# Patient Record
Sex: Female | Born: 1990 | Race: White | Hispanic: No | Marital: Married | Smoking: Never smoker
Health system: Southern US, Community
[De-identification: ages and names within clinical notes are randomized; demographics above are authoritative.]

## PROBLEM LIST (undated history)

## (undated) DIAGNOSIS — E611 Iron deficiency: Secondary | ICD-10-CM

## (undated) DIAGNOSIS — T783XXA Angioneurotic edema, initial encounter: Secondary | ICD-10-CM

## (undated) DIAGNOSIS — E162 Hypoglycemia, unspecified: Secondary | ICD-10-CM

## (undated) DIAGNOSIS — R51 Headache: Secondary | ICD-10-CM

## (undated) DIAGNOSIS — I10 Essential (primary) hypertension: Secondary | ICD-10-CM

## (undated) DIAGNOSIS — L509 Urticaria, unspecified: Secondary | ICD-10-CM

## (undated) DIAGNOSIS — F419 Anxiety disorder, unspecified: Secondary | ICD-10-CM

## (undated) DIAGNOSIS — D649 Anemia, unspecified: Secondary | ICD-10-CM

## (undated) DIAGNOSIS — M1990 Unspecified osteoarthritis, unspecified site: Secondary | ICD-10-CM

## (undated) DIAGNOSIS — R519 Headache, unspecified: Secondary | ICD-10-CM

## (undated) DIAGNOSIS — N809 Endometriosis, unspecified: Secondary | ICD-10-CM

## (undated) HISTORY — DX: Angioneurotic edema, initial encounter: T78.3XXA

## (undated) HISTORY — DX: Anxiety disorder, unspecified: F41.9

## (undated) HISTORY — DX: Urticaria, unspecified: L50.9

## (undated) HISTORY — PX: OTHER SURGICAL HISTORY: SHX169

## (undated) HISTORY — DX: Essential (primary) hypertension: I10

## (undated) HISTORY — DX: Hypoglycemia, unspecified: E16.2

---
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
## 2012-11-19 DIAGNOSIS — R079 Chest pain, unspecified: Secondary | ICD-10-CM

## 2014-07-02 ENCOUNTER — Telehealth: Payer: Self-pay | Admitting: Family Medicine

## 2014-07-02 NOTE — Telephone Encounter (Signed)
Pt given new pt appt with Dr.Stacks 3/21 at 9:55, pt aware to arrive 15 minutes prior with a copy of insurance card and a list of all current medications. Pt states she isn't on any current medications.

## 2014-08-10 ENCOUNTER — Encounter: Payer: Self-pay | Admitting: Family Medicine

## 2014-08-10 ENCOUNTER — Ambulatory Visit (INDEPENDENT_AMBULATORY_CARE_PROVIDER_SITE_OTHER): Payer: BLUE CROSS/BLUE SHIELD | Admitting: Family Medicine

## 2014-08-10 VITALS — BP 118/81 | HR 86 | Temp 97.6°F | Ht 62.5 in | Wt 186.0 lb

## 2014-08-10 DIAGNOSIS — F411 Generalized anxiety disorder: Secondary | ICD-10-CM

## 2014-08-10 DIAGNOSIS — R072 Precordial pain: Secondary | ICD-10-CM | POA: Diagnosis not present

## 2014-08-10 LAB — POCT CBC
Granulocyte percent: 76 % (ref 37–80)
HCT, POC: 38.5 % (ref 37.7–47.9)
Hemoglobin: 11.8 g/dL — AB (ref 12.2–16.2)
Lymph, poc: 1.3 (ref 0.6–3.4)
MCH, POC: 22.7 pg — AB (ref 27–31.2)
MCHC: 29.8 g/dL — AB (ref 31.8–35.4)
MCV: 76.2 fL — AB (ref 80–97)
MPV: 7.7 fL (ref 0–99.8)
POC Granulocyte: 4.7 (ref 2–6.9)
POC LYMPH PERCENT: 20.7 % (ref 10–50)
Platelet Count, POC: 352 10*3/uL (ref 142–424)
RBC: 5.18 M/uL (ref 4.04–5.48)
RDW, POC: 16 %
WBC: 6.2 10*3/uL (ref 4.6–10.2)

## 2014-08-10 MED ORDER — DULOXETINE HCL 30 MG PO CPEP: 30.0000 mg | ORAL_CAPSULE | Freq: Every day | ORAL | Status: DC

## 2014-08-10 NOTE — Progress Notes (Signed)
Subjective:  Patient ID: Julie Kim, female    DOB: 1991-03-30  Age: 24 y.o. MRN: [id number]  CC: Establish Care and Depression   HPI Julie Kim presents for BPR 160s/100-110. First noted 2 months ago. [facility] for anxiety.Sudden onset spells since childhood. 30 min. Several X a week. Random. Patient notices significant chest pain and will break down crying. She doesn't know why. She has no thoughts of anything abnormal happening in her life at the time. There may be a sense of dysphoria but not of the ominous occurrence. She went to urgent care for this about 6 months ago and had a EKG performed. She was started on 2 different medicines. She does not remember the names of those. But she could not tolerate them so she discontinued them. Unfortunately the anxiety attacks continued.  History Julie Kim has a past medical history of Anxiety; Hypertension; and Hypoglycemia.   She has past surgical history that includes none.   Her family history includes Heart disease in her paternal uncle; Heart disease (age of onset: 0) in her brother.She reports that she has never smoked. She does not have any smokeless tobacco history on file. She reports that she does not drink alcohol or use illicit drugs.  No current outpatient prescriptions on file prior to visit.   No current facility-administered medications on file prior to visit.    ROS Review of Systems  Constitutional: Negative for fever, chills, diaphoresis, appetite change, fatigue and unexpected weight change.  HENT: Negative for congestion, ear pain, hearing loss, postnasal drip, rhinorrhea, sneezing, sore throat and trouble swallowing.   Eyes: Negative for pain.  Respiratory: Negative for cough, chest tightness and shortness of breath.   Cardiovascular: Negative for chest pain and palpitations.  Gastrointestinal: Negative for nausea, vomiting, abdominal pain, diarrhea and constipation.  Genitourinary: Negative  for dysuria, frequency and menstrual problem.  Musculoskeletal: Negative for joint swelling and arthralgias.  Skin: Negative for rash.  Neurological: Negative for dizziness, weakness, numbness and headaches.  Psychiatric/Behavioral: Positive for dysphoric mood. Negative for suicidal ideas, behavioral problems, confusion, sleep disturbance and agitation. The patient is nervous/anxious.     Objective:  BP 118/81 mmHg  Pulse 86  Temp(Src) 97.6 F (36.4 C) (Oral)  Ht 5' 2.5" (1.588 m)  Wt 186 lb (84.369 kg)  BMI 33.46 kg/m2  LMP 07/12/2014  Physical Exam  Constitutional: She is oriented to person, place, and time. She appears well-developed and well-nourished. No distress.  HENT:  Head: Normocephalic and atraumatic.  Right Ear: External ear normal.  Left Ear: External ear normal.  Nose: Nose normal.  Mouth/Throat: Oropharynx is clear and moist.  Eyes: Conjunctivae and EOM are normal. Pupils are equal, round, and reactive to light.  Neck: Normal range of motion. Neck supple. No thyromegaly present.  Cardiovascular: Normal rate, regular rhythm and normal heart sounds.   No murmur heard. Pulmonary/Chest: Effort normal and breath sounds normal. No respiratory distress. She has no wheezes. She has no rales.  Abdominal: Soft. Bowel sounds are normal. She exhibits no distension. There is no tenderness.  Lymphadenopathy:    She has no cervical adenopathy.  Neurological: She is alert and oriented to person, place, and time. She has normal reflexes.  Skin: Skin is warm and dry.  Psychiatric: She has a normal mood and affect. Her behavior is normal. Judgment and thought content normal.    Assessment & Plan:   1. Generalized anxiety disorder   2. Precordial pain  Meds ordered this encounter  Medications  . DULoxetine (CYMBALTA) 30 MG capsule    Sig: Take 1 capsule (30 mg total) by mouth daily. For one week then two daily. Take with a full stomach at suppertime    Dispense:  60  capsule    Refill:  0    Orders Placed This Encounter  Procedures  . NMR, lipoprofile  . Thyroid Panel With TSH  . CMP14+EGFR  . POCT CBC    Labs pending Health Maintenance reviewed Diet and exercise encouraged Continue all meds as discussed Follow up in 3-4 weeks  Claretta Fraise, MD    I am having Julie Kim start on DULoxetine.  Meds ordered this encounter  Medications  . DULoxetine (CYMBALTA) 30 MG capsule    Sig: Take 1 capsule (30 mg total) by mouth daily. For one week then two daily. Take with a full stomach at suppertime    Dispense:  60 capsule    Refill:  0     Follow-up: Return in about 3 weeks (around 08/31/2014).  Claretta Fraise, M.D.

## 2014-08-10 NOTE — Patient Instructions (Signed)
DASH Eating Plan °DASH stands for "Dietary Approaches to Stop Hypertension." The DASH eating plan is a healthy eating plan that has been shown to reduce high blood pressure (hypertension). Additional health benefits may include reducing the risk of type 2 diabetes mellitus, heart disease, and stroke. The DASH eating plan may also help with weight loss. °WHAT DO I NEED TO KNOW ABOUT THE DASH EATING PLAN? °For the DASH eating plan, you will follow these general guidelines: °· Choose foods with a percent daily value for sodium of less than 5% (as listed on the food label). °· Use salt-free seasonings or herbs instead of table salt or sea salt. °· Check with your health care provider or pharmacist before using salt substitutes. °· Eat lower-sodium products, often labeled as "lower sodium" or "no salt added." °· Eat fresh foods. °· Eat more vegetables, fruits, and low-fat dairy products. °· Choose whole grains. Look for the word "whole" as the first word in the ingredient list. °· Choose fish and skinless chicken or turkey more often than red meat. Limit fish, poultry, and meat to 6 oz (170 g) each day. °· Limit sweets, desserts, sugars, and sugary drinks. °· Choose heart-healthy fats. °· Limit cheese to 1 oz (28 g) per day. °· Eat more home-cooked food and less restaurant, buffet, and fast food. °· Limit fried foods. °· Cook foods using methods other than frying. °· Limit canned vegetables. If you do use them, rinse them well to decrease the sodium. °· When eating at a restaurant, ask that your food be prepared with less salt, or no salt if possible. °WHAT FOODS CAN I EAT? °Seek help from a dietitian for individual calorie needs. °Grains °Whole grain or whole wheat bread. Brown rice. Whole grain or whole wheat pasta. Quinoa, bulgur, and whole grain cereals. Low-sodium cereals. Corn or whole wheat flour tortillas. Whole grain cornbread. Whole grain crackers. Low-sodium crackers. °Vegetables °Fresh or frozen vegetables  (raw, steamed, roasted, or grilled). Low-sodium or reduced-sodium tomato and vegetable juices. Low-sodium or reduced-sodium tomato sauce and paste. Low-sodium or reduced-sodium canned vegetables.  °Fruits °All fresh, canned (in natural juice), or frozen fruits. °Meat and Other Protein Products °Ground beef (85% or leaner), grass-fed beef, or beef trimmed of fat. Skinless chicken or turkey. Ground chicken or turkey. Pork trimmed of fat. All fish and seafood. Eggs. Dried beans, peas, or lentils. Unsalted nuts and seeds. Unsalted canned beans. °Dairy °Low-fat dairy products, such as skim or 1% milk, 2% or reduced-fat cheeses, low-fat ricotta or cottage cheese, or plain low-fat yogurt. Low-sodium or reduced-sodium cheeses. °Fats and Oils °Tub margarines without trans fats. Light or reduced-fat mayonnaise and salad dressings (reduced sodium). Avocado. Safflower, olive, or canola oils. Natural peanut or almond butter. °Other °Unsalted popcorn and pretzels. °The items listed above may not be a complete list of recommended foods or beverages. Contact your dietitian for more options. °WHAT FOODS ARE NOT RECOMMENDED? °Grains °White bread. White pasta. White rice. Refined cornbread. Bagels and croissants. Crackers that contain trans fat. °Vegetables °Creamed or fried vegetables. Vegetables in a cheese sauce. Regular canned vegetables. Regular canned tomato sauce and paste. Regular tomato and vegetable juices. °Fruits °Dried fruits. Canned fruit in light or heavy syrup. Fruit juice. °Meat and Other Protein Products °Fatty cuts of meat. Ribs, chicken wings, bacon, sausage, bologna, salami, chitterlings, fatback, hot dogs, bratwurst, and packaged luncheon meats. Salted nuts and seeds. Canned beans with salt. °Dairy °Whole or 2% milk, cream, half-and-half, and cream cheese. Whole-fat or sweetened yogurt. Full-fat   cheeses or blue cheese. Nondairy creamers and whipped toppings. Processed cheese, cheese spreads, or cheese  curds. °Condiments °Onion and garlic salt, seasoned salt, table salt, and sea salt. Canned and packaged gravies. Worcestershire sauce. Tartar sauce. Barbecue sauce. Teriyaki sauce. Soy sauce, including reduced sodium. Steak sauce. Fish sauce. Oyster sauce. Cocktail sauce. Horseradish. Ketchup and mustard. Meat flavorings and tenderizers. Bouillon cubes. Hot sauce. Tabasco sauce. Marinades. Taco seasonings. Relishes. °Fats and Oils °Butter, stick margarine, lard, shortening, ghee, and bacon fat. Coconut, palm kernel, or palm oils. Regular salad dressings. °Other °Pickles and olives. Salted popcorn and pretzels. °The items listed above may not be a complete list of foods and beverages to avoid. Contact your dietitian for more information. °WHERE CAN I FIND MORE INFORMATION? °National Heart, [employer], and [employer] Institute: www.nhlbi.nih.gov/health/health-topics/topics/dash/ °Document Released: 04/27/2011 Document Revised: 09/22/2013 Document Reviewed: 03/12/2013 °ExitCare® Patient Information ©2015 [employer]. This information is not intended to replace advice given to you by your health care provider. Make sure you discuss any questions you have with your health care provider. ° °

## 2014-08-11 LAB — CMP14+EGFR
ALT: 18 [IU]/L (ref 0–32)
AST: 16 [IU]/L (ref 0–40)
Albumin/Globulin Ratio: 1.7 (ref 1.1–2.5)
Albumin: 4.7 g/dL (ref 3.5–5.5)
Alkaline Phosphatase: 101 [IU]/L (ref 39–117)
BUN/Creatinine Ratio: 16 (ref 8–20)
BUN: 10 mg/dL (ref 6–20)
Bilirubin Total: 0.3 mg/dL (ref 0.0–1.2)
CO2: 23 mmol/L (ref 18–29)
Calcium: 9.6 mg/dL (ref 8.7–10.2)
Chloride: 101 mmol/L (ref 97–108)
Creatinine, Ser: 0.62 mg/dL (ref 0.57–1.00)
GFR calc Af Amer: 147 mL/min/{1.73_m2} (ref >59)
GFR calc non Af Amer: 128 mL/min/{1.73_m2} (ref >59)
Globulin, Total: 2.8 g/dL (ref 1.5–4.5)
Glucose: 81 mg/dL (ref 65–99)
Potassium: 4.4 mmol/L (ref 3.5–5.2)
Sodium: 138 mmol/L (ref 134–144)
Total Protein: 7.5 g/dL (ref 6.0–8.5)

## 2014-08-11 LAB — THYROID PANEL WITH TSH
Free Thyroxine Index: 2.3 (ref 1.2–4.9)
T3 Uptake Ratio: 27 % (ref 24–39)
T4, Total: 8.7 ug/dL (ref 4.5–12.0)
TSH: 2.45 u[IU]/mL (ref 0.450–4.500)

## 2014-08-11 LAB — NMR, LIPOPROFILE
Cholesterol: 173 mg/dL (ref 100–199)
HDL Cholesterol by NMR: 41 mg/dL (ref >39)
HDL Particle Number: 30 umol/L — ABNORMAL LOW (ref >=30.5)
LDL Particle Number: 1099 nmol/L — ABNORMAL HIGH (ref <1000)
LDL Size: 20.7 nmol (ref >20.5)
LDL-C: 93 mg/dL (ref 0–99)
LP-IR Score: 75 — ABNORMAL HIGH (ref <=45)
Small LDL Particle Number: 522 nmol/L (ref <=527)
Triglycerides by NMR: 193 mg/dL — ABNORMAL HIGH (ref 0–149)

## 2014-08-27 ENCOUNTER — Telehealth: Payer: Self-pay | Admitting: Family Medicine

## 2014-08-27 MED ORDER — ESCITALOPRAM OXALATE 10 MG PO TABS: 10.0000 mg | ORAL_TABLET | Freq: Every day | ORAL | Status: DC

## 2014-08-27 NOTE — Telephone Encounter (Signed)
Pt aware rx sent over to the pharmacy and advised to continue to monitor BP and report back if the readings are still elevated. Pt voiced understanding.

## 2014-08-27 NOTE — Telephone Encounter (Signed)
Stp and she states her BP was running 180's/100 last night. Pt was wondering if it is from the Cymbalta but noticed in your notes she had told you her BP readings prior to her appt were 160's/100-110. Do you want her to keep a log and come in to see you or do you want her to be seen? Please advise.

## 2014-08-27 NOTE — Telephone Encounter (Signed)
It is possible for Cymbalta to elevate blood pressure. Although it might be that it is just causing the pre-existing high blood pressure to go even higher.   Because of that she should discontinue the medicine. Please substitute escitalopram 10 mg 1 daily #30 no refills and call that in. Thanks, WS.

## 2015-03-11 ENCOUNTER — Ambulatory Visit: Payer: BLUE CROSS/BLUE SHIELD | Admitting: Family Medicine

## 2015-03-12 ENCOUNTER — Ambulatory Visit (INDEPENDENT_AMBULATORY_CARE_PROVIDER_SITE_OTHER): Payer: BLUE CROSS/BLUE SHIELD | Admitting: Pediatrics

## 2015-03-12 ENCOUNTER — Encounter: Payer: Self-pay | Admitting: Pediatrics

## 2015-03-12 VITALS — BP 117/80 | HR 80 | Temp 97.1°F | Ht 62.0 in | Wt 190.4 lb

## 2015-03-12 DIAGNOSIS — M25521 Pain in right elbow: Secondary | ICD-10-CM

## 2015-03-12 DIAGNOSIS — M79621 Pain in right upper arm: Secondary | ICD-10-CM | POA: Diagnosis not present

## 2015-03-12 NOTE — Progress Notes (Signed)
Subjective:    Patient ID: Julie Kim, female    DOB: 12-13-1990, 24 y.o.   MRN: [id number]  CC: arm pain  HPI: Julie Kim is a 24 y.o. female presenting on 03/12/2015 for Arm Pain  R arm hurting if she uses too much More she writes more it hurts Hurts more at night Points to upper arm  No other limbs affect Sometimes hand doesn't grab things like she thinks is normal Sometimes has problems opening potato chip bags Ongoing for past two months Ibuprofen doesn't seem to help, takes two every 8 hours   Relevant past medical, surgical, family and social history reviewed and updated as indicated. Interim medical history since our last visit reviewed. Allergies and medications reviewed and updated.   ROS: Per HPI unless specifically indicated above  Past Medical History There are no active problems to display for this patient.   No current outpatient prescriptions on file.   No current facility-administered medications for this visit.       Objective:    BP 117/80 mmHg  Pulse 80  Temp(Src) 97.1 F (36.2 C) (Oral)  Ht '5\' 2"'  (1.575 m)  Wt 190 lb 6.4 oz (86.365 kg)  BMI 34.82 kg/m2  Wt Readings from Last 3 Encounters:  03/12/15 190 lb 6.4 oz (86.365 kg)  08/10/14 186 lb (84.369 kg)    Gen: NAD, alert, cooperative with exam, NCAT EYES: EOMI, no scleral injection or icterus ENT:  TMs pearly gray b/l, OP without erythema LYMPH: no cervical LAD CV: NRRR, normal S1/S2, no murmur, radial pulses 2+ b/l Resp: CTABL, no wheezes, normal WOB Abd: +BS, soft, NTND. no guarding or organomegaly Ext: No edema, warm Neuro: Alert and oriented, hand grip 4/5 R, 5/5 L, wrist ext/flex 4/5 R, 5/5 L, elbow ext/flex 4/5 R, 5/5 L. Triceps and BR reflexes 2+ equal b/l.  MSK: R upper arm slightly bigger compared with L. No swelling b/l hands. Tender with palpation over deltoid and upper arm muscles R side, also tender over prox lower arm muscles with squeeze. POint tenderness  posterior shoulder in soft tissue. No pain with passive ROM at R shoulder, pain with active ROM over 90 degrees, points to posterior shoulder as location of pain. No point tenderness over spine. Normal ROM of neck with no exacerbation of symptoms.     Assessment & Plan:    Julie Kim was seen today for arm pain in shoulder and muscles of R arm and intermittent swelling of extremity for past 2 months of unclear etiology. No personal hx of clot, both grandmothers had blood clots per pt when they were elderly. Pt with no prior bleeding problems. Will get u/s to rule out venous thrombosis, CK/LFTs and ESR to evaluate for myositis though would be unusual to have isolated unilateral UE myositis with no other muscles involved. It is her dominant hand. Shoulder pathology from over use also on Ddx though would be unusual for that to cause weakness in hand. May need further imaging of extremity such as MRI if symptoms continue, certainly if they are worsening. No other focal neurologic deficits, equal reflexes b/l UE.  Diagnoses and all orders for this visit:  Pain in joint, upper arm, right -     Ultrasound doppler venous arms bilateral; Future -     CK -     Hepatic function panel -     Sedimentation rate  Follow up plan: Return in about 2 weeks (around 03/26/2015).  Assunta Found, MD [facility] 03/13/2015, 10:15 AM

## 2015-03-13 LAB — HEPATIC FUNCTION PANEL
ALT: 27 [IU]/L (ref 0–32)
AST: 24 [IU]/L (ref 0–40)
Albumin: 4.5 g/dL (ref 3.5–5.5)
Alkaline Phosphatase: 100 [IU]/L (ref 39–117)
Bilirubin Total: 0.4 mg/dL (ref 0.0–1.2)
Bilirubin, Direct: 0.11 mg/dL (ref 0.00–0.40)
Total Protein: 7.5 g/dL (ref 6.0–8.5)

## 2015-03-13 LAB — SEDIMENTATION RATE: Sed Rate: 6 mm/h (ref 0–32)

## 2015-03-13 LAB — CK: Total CK: 72 U/L (ref 24–173)

## 2015-07-08 ENCOUNTER — Ambulatory Visit (INDEPENDENT_AMBULATORY_CARE_PROVIDER_SITE_OTHER): Payer: BLUE CROSS/BLUE SHIELD | Admitting: Nurse Practitioner

## 2015-07-08 ENCOUNTER — Encounter: Payer: Self-pay | Admitting: Nurse Practitioner

## 2015-07-08 VITALS — BP 118/84 | HR 92 | Temp 97.1°F | Ht 62.0 in | Wt 191.0 lb

## 2015-07-08 DIAGNOSIS — R509 Fever, unspecified: Secondary | ICD-10-CM

## 2015-07-08 DIAGNOSIS — J0100 Acute maxillary sinusitis, unspecified: Secondary | ICD-10-CM | POA: Diagnosis not present

## 2015-07-08 LAB — POCT INFLUENZA A/B
Influenza A, POC: NEGATIVE
Influenza B, POC: NEGATIVE

## 2015-07-08 MED ORDER — AZITHROMYCIN 250 MG PO TABS: ORAL_TABLET | ORAL | Status: DC

## 2015-07-08 NOTE — Patient Instructions (Signed)

## 2015-07-08 NOTE — Progress Notes (Signed)
  Subjective:     Julie Kim is a 25 y.o. female who presents for evaluation of sinus pain. Symptoms include: congestion, cough, fevers, headaches and sinus pressure. Onset of symptoms was 1 day ago. Symptoms have been unchanged since that time. Past history is significant for no history of pneumonia or bronchitis. Patient is a non-smoker. Was sick last Thursday with fever and cough- got better then started with fever again last night. The following portions of the patient's history were reviewed and updated as appropriate: allergies, current medications, past family history, past medical history, past social history, past surgical history and problem list.  Review of Systems Pertinent items are noted in HPI.   Objective:    BP 118/84 mmHg  Pulse 92  Temp(Src) 97.1 F (36.2 C) (Oral)  Ht  (1.575 m)  Wt 191 lb (86.637 kg)  BMI 34.93 kg/m2 General appearance: alert, cooperative, appears stated age, fatigued and no distress Eyes: negative findings: lids and lashes normal, conjunctivae and sclerae normal, corneas clear, pupils equal, round, reactive to light and accomodation, visual fields full to confrontation and optic nerve appearance unremarkable Ears: normal TM's and external ear canals both ears Nose: Nares normal. Septum midline. Mucosa normal. No drainage or sinus tenderness. turbinates erythematous bil Throat: lips, mucosa, and tongue normal; teeth and gums normal Neck: no adenopathy, no carotid bruit, no JVD, supple, symmetrical, trachea midline and thyroid not enlarged, symmetric, no tenderness/mass/nodules Lungs: clear to auscultation bilaterally Heart:  Normal rate and rhythm  Results for orders placed or performed in visit on 07/08/15  POCT Influenza A/B  Result Value Ref Range   Influenza A, POC Negative Negative   Influenza B, POC Negative Negative    Assessment:    Acute bacterial sinusitis.    Plan:  1. Take meds as prescribed 2. Use a cool mist  humidifier especially during the winter months and when heat has been humid. 3. Use saline nose sprays frequently 4. Saline irrigations of the nose can be very helpful if done frequently.  * 4X daily for 1 week*  * Use of a nettie pot can be helpful with this. Follow directions with this* 5. Drink plenty of fluids 6. Keep thermostat turn down low 7.For any cough or congestion  Use plain Mucinex- regular strength or max strength is fine   * Children- consult with Pharmacist for dosing 8. For fever or aces or pains- take tylenol or ibuprofen appropriate for age and weight.  * for fevers greater than 101 orally you may alternate ibuprofen and tylenol every  3 hours.   Meds ordered this encounter  Medications  . azithromycin (ZITHROMAX) 250 MG tablet    Sig: Two tablets day one, then one tablet daily next 4 days.    Dispense:  6 tablet    Refill:  0    Order Specific Question:  Supervising Provider    Answer:  Ernestina Penna [[id number]]   Mary-Margaret Daphine Deutscher, FNP

## 2016-03-15 ENCOUNTER — Encounter: Payer: Self-pay | Admitting: Family Medicine

## 2016-03-15 ENCOUNTER — Ambulatory Visit (INDEPENDENT_AMBULATORY_CARE_PROVIDER_SITE_OTHER): Payer: BLUE CROSS/BLUE SHIELD | Admitting: Family Medicine

## 2016-03-15 VITALS — BP 124/84 | HR 78 | Temp 97.9°F | Ht 62.0 in | Wt 190.4 lb

## 2016-03-15 DIAGNOSIS — L739 Follicular disorder, unspecified: Secondary | ICD-10-CM | POA: Diagnosis not present

## 2016-03-15 MED ORDER — DOXYCYCLINE HYCLATE 100 MG PO CAPS: 100.0000 mg | ORAL_CAPSULE | Freq: Two times a day (BID) | ORAL | 0 refills | Status: DC

## 2016-03-15 MED ORDER — TRIAMCINOLONE ACETONIDE 0.1 % EX CREA: 1.0000 "application " | TOPICAL_CREAM | Freq: Three times a day (TID) | CUTANEOUS | 0 refills | Status: DC

## 2016-03-15 NOTE — Progress Notes (Signed)
Subjective:  Patient ID: Julie BuntingHope Kim, female    DOB: 10/20/1990  Age: 25 y.o. MRN: [id number]  CC: Rash (pt here today c/o rash on the back of her upper right thigh on the underside)   HPI Julie Kim presents for Several blisterlike lesions at the inner upper thigh on the right. Burning and stinging at times. The rash does not continue into the vulva.   History Julie Kim has a past medical history of Anxiety; Hypertension; and Hypoglycemia.   She has a past surgical history that includes none.   Her family history includes Heart disease in her paternal uncle; Heart disease (age of onset: 0) in her brother.She reports that she has never smoked. She has never used smokeless tobacco. She reports that she does not drink alcohol or use drugs.    ROS Review of Systems  Constitutional: Negative for activity change, appetite change and fever.  HENT: Negative for congestion, rhinorrhea and sore throat.   Eyes: Negative for visual disturbance.  Respiratory: Negative for cough and shortness of breath.   Cardiovascular: Negative for chest pain and palpitations.  Gastrointestinal: Negative for abdominal pain, diarrhea and nausea.  Genitourinary: Negative for dysuria.  Musculoskeletal: Negative for arthralgias and myalgias.  Skin: Negative for color change, pallor and wound.    Objective:  BP 124/84   Pulse 78   Temp 97.9 F (36.6 C) (Oral)   Ht 5\' 2"  (1.575 m)   Wt 190 lb 6 oz (86.4 kg)   LMP 03/14/2016 (Exact Date)   BMI 34.82 kg/m   BP Readings from Last 3 Encounters:  03/15/16 124/84  07/08/15 118/84  03/12/15 117/80    Wt Readings from Last 3 Encounters:  03/15/16 190 lb 6 oz (86.4 kg)  07/08/15 191 lb (86.6 kg)  03/12/15 190 lb 6.4 oz (86.4 kg)     Physical Exam  Constitutional: She is oriented to person, place, and time. She appears well-developed and well-nourished. No distress.  Cardiovascular: Normal rate and regular rhythm.   Pulmonary/Chest: Breath sounds  normal.  Neurological: She is alert and oriented to person, place, and time.  Skin: Skin is warm and dry.  There are six 2 mm excoriated vesicles. At the inner upper right thigh. They are 4 cm from the vulva. The vault is free of lesion. Lesions are tender. There is no erythema.  Psychiatric: She has a normal mood and affect.     Lab Results  Component Value Date   WBC 6.2 08/10/2014   HGB 11.8 (A) 08/10/2014   HCT 38.5 08/10/2014   GLUCOSE 81 08/10/2014   CHOL 173 08/10/2014   TRIG 193 (H) 08/10/2014   HDL 41 08/10/2014   ALT 27 03/12/2015   AST 24 03/12/2015   NA 138 08/10/2014   K 4.4 08/10/2014   CL 101 08/10/2014   CREATININE 0.62 08/10/2014   BUN 10 08/10/2014   CO2 23 08/10/2014   TSH 2.450 08/10/2014    Patient was never admitted.  Assessment & Plan:   Julie Kim was seen today for rash.  Diagnoses and all orders for this visit:  Folliculitis  Other orders -     doxycycline (VIBRAMYCIN) 100 MG capsule; Take 1 capsule (100 mg total) by mouth 2 (two) times daily. -     triamcinolone cream (KENALOG) 0.1 %; Apply 1 application topically 3 (three) times daily. Avoid face and genitalia    I have discontinued Ms. Herd's azithromycin. I am also having her start on doxycycline and triamcinolone cream.  Meds ordered this encounter  Medications  . doxycycline (VIBRAMYCIN) 100 MG capsule    Sig: Take 1 capsule (100 mg total) by mouth 2 (two) times daily.    Dispense:  20 capsule    Refill:  0  . triamcinolone cream (KENALOG) 0.1 %    Sig: Apply 1 application topically 3 (three) times daily. Avoid face and genitalia    Dispense:  45 g    Refill:  0     Follow-up: Return if symptoms worsen or fail to improve.  Mechele Claude, M.D.

## 2016-03-21 ENCOUNTER — Encounter: Payer: Self-pay | Admitting: Family Medicine

## 2016-05-26 ENCOUNTER — Other Ambulatory Visit: Payer: Self-pay | Admitting: Obstetrics & Gynecology

## 2016-06-06 ENCOUNTER — Encounter (HOSPITAL_COMMUNITY)
Admission: RE | Admit: 2016-06-06 | Discharge: 2016-06-06 | Disposition: A | Discharge status: Home / Self Care | Payer: BLUE CROSS/BLUE SHIELD | Source: Ambulatory Visit | Attending: Obstetrics & Gynecology | Admitting: Obstetrics & Gynecology

## 2016-06-06 ENCOUNTER — Encounter (HOSPITAL_COMMUNITY): Payer: Self-pay

## 2016-06-06 DIAGNOSIS — Z01812 Encounter for preprocedural laboratory examination: Secondary | ICD-10-CM | POA: Insufficient documentation

## 2016-06-06 DIAGNOSIS — N809 Endometriosis, unspecified: Secondary | ICD-10-CM | POA: Insufficient documentation

## 2016-06-06 HISTORY — DX: Iron deficiency: E61.1

## 2016-06-06 HISTORY — DX: Anemia, unspecified: D64.9

## 2016-06-06 HISTORY — DX: Headache, unspecified: R51.9

## 2016-06-06 HISTORY — DX: Unspecified osteoarthritis, unspecified site: M19.90

## 2016-06-06 HISTORY — DX: Headache: R51

## 2016-06-06 LAB — BASIC METABOLIC PANEL
Anion gap: 7 (ref 5–15)
BUN: 18 mg/dL (ref 6–20)
CO2: 27 mmol/L (ref 22–32)
Calcium: 9.3 mg/dL (ref 8.9–10.3)
Chloride: 104 mmol/L (ref 101–111)
Creatinine, Ser: 0.63 mg/dL (ref 0.44–1.00)
GFR calc Af Amer: >60 mL/min (ref >60)
GFR calc non Af Amer: >60 mL/min (ref >60)
Glucose, Bld: 107 mg/dL — ABNORMAL HIGH (ref 65–99)
Potassium: 4.4 mmol/L (ref 3.5–5.1)
Sodium: 138 mmol/L (ref 135–145)

## 2016-06-06 LAB — CBC
HCT: 34.2 % — ABNORMAL LOW (ref 36.0–46.0)
Hemoglobin: 11.1 g/dL — ABNORMAL LOW (ref 12.0–15.0)
MCH: 24.6 pg — ABNORMAL LOW (ref 26.0–34.0)
MCHC: 32.5 g/dL (ref 30.0–36.0)
MCV: 75.8 fL — ABNORMAL LOW (ref 78.0–100.0)
Platelets: 330 10*3/uL (ref 150–400)
RBC: 4.51 MIL/uL (ref 3.87–5.11)
RDW: 15.2 % (ref 11.5–15.5)
WBC: 6.7 10*3/uL (ref 4.0–10.5)

## 2016-06-06 NOTE — Patient Instructions (Addendum)
Your procedure is scheduled on:  Friday, Jan. 26, 2018  [facility]  [facility]:00 AM  Pick up the phone at the desk and dial [phone].  Call this number if you have problems the morning of surgery: [phone].  Remember: Do NOT eat food or drink after: Midnight Thursday, Jan. 25, 2018  Take these medicines the morning of surgery with a SIP OF WATER:  None  Stop ALL herbal medications at this time  Do NOT smoke the day of surgery.  Do NOT wear jewelry (body piercing), metal hair clips/bobby pins, make-up, or nail polish. Do NOT wear lotions, powders, or perfumes.  You may wear deodorant. Do NOT shave for 48 hours prior to surgery. Do NOT bring valuables to the hospital. Contacts, dentures, or bridgework may not be worn into surgery.  Leave suitcase in car.  After surgery it may be brought to your room.  For patients admitted to the hospital, checkout time is 11:00 AM the day of discharge.  Have a responsible adult drive you home and stay with you for 24 hours after your procedure  Bring a copy of your healthcare power of attorney and living will documents.  **Effective Friday, Jan. 12, 2018, [place] will implement no hospital visitations from children age 26 and younger due to a steady increase in flu activity in our community and hospitals. **

## 2016-06-15 ENCOUNTER — Encounter (HOSPITAL_COMMUNITY): Payer: Self-pay | Admitting: Anesthesiology

## 2016-06-15 NOTE — Anesthesia Preprocedure Evaluation (Addendum)
Anesthesia Evaluation  Patient identified by MRN, date of birth, ID band Patient awake    Reviewed: Allergy & Precautions, H&P , NPO status , Patient's Chart, lab work & pertinent test results, reviewed documented beta blocker date and time   Airway Mallampati: I  TM Distance: >3 FB Neck ROM: full    Dental no notable dental hx. (+) Teeth Intact   Pulmonary neg pulmonary ROS,    Pulmonary exam normal        Cardiovascular hypertension, Normal cardiovascular exam     Neuro/Psych    GI/Hepatic negative GI ROS, Neg liver ROS,   Endo/Other  negative endocrine ROS  Renal/GU negative Renal ROS     Musculoskeletal   Abdominal (+) + obese,   Peds  Hematology   Anesthesia Other Findings   Reproductive/Obstetrics negative OB ROS                            Anesthesia Physical Anesthesia Plan  ASA: II  Anesthesia Plan: General   Post-op Pain Management:    Induction: Intravenous  Airway Management Planned: Oral ETT  Additional Equipment:   Intra-op Plan:   Post-operative Plan: Extubation in OR  Informed Consent: I have reviewed the patients History and Physical, chart, labs and discussed the procedure including the risks, benefits and alternatives for the proposed anesthesia with the patient or authorized representative who has indicated his/her understanding and acceptance.   Dental Advisory Given and History available from chart only  Plan Discussed with: CRNA and Surgeon  Anesthesia Plan Comments:        Anesthesia Quick Evaluation

## 2016-06-16 ENCOUNTER — Ambulatory Visit (HOSPITAL_COMMUNITY): Payer: BLUE CROSS/BLUE SHIELD | Admitting: Anesthesiology

## 2016-06-16 ENCOUNTER — Ambulatory Visit (HOSPITAL_COMMUNITY)
Admission: RE | Admit: 2016-06-16 | Discharge: 2016-06-16 | Disposition: A | Discharge status: Home / Self Care | Payer: BLUE CROSS/BLUE SHIELD | Source: Ambulatory Visit | Attending: Obstetrics & Gynecology | Admitting: Obstetrics & Gynecology

## 2016-06-16 ENCOUNTER — Encounter (HOSPITAL_COMMUNITY): Payer: Self-pay | Admitting: Emergency Medicine

## 2016-06-16 ENCOUNTER — Encounter (HOSPITAL_COMMUNITY)
Admission: RE | Disposition: A | Discharge status: Home / Self Care | Payer: Self-pay | Source: Ambulatory Visit | Attending: Obstetrics & Gynecology

## 2016-06-16 DIAGNOSIS — E669 Obesity, unspecified: Secondary | ICD-10-CM | POA: Diagnosis not present

## 2016-06-16 DIAGNOSIS — N803 Endometriosis of pelvic peritoneum: Secondary | ICD-10-CM | POA: Insufficient documentation

## 2016-06-16 DIAGNOSIS — Z79899 Other long term (current) drug therapy: Secondary | ICD-10-CM | POA: Diagnosis not present

## 2016-06-16 DIAGNOSIS — G8929 Other chronic pain: Secondary | ICD-10-CM | POA: Diagnosis not present

## 2016-06-16 DIAGNOSIS — F419 Anxiety disorder, unspecified: Secondary | ICD-10-CM | POA: Diagnosis not present

## 2016-06-16 DIAGNOSIS — N979 Female infertility, unspecified: Secondary | ICD-10-CM | POA: Insufficient documentation

## 2016-06-16 DIAGNOSIS — R102 Pelvic and perineal pain: Secondary | ICD-10-CM | POA: Insufficient documentation

## 2016-06-16 DIAGNOSIS — N736 Female pelvic peritoneal adhesions (postinfective): Secondary | ICD-10-CM | POA: Diagnosis not present

## 2016-06-16 DIAGNOSIS — G43909 Migraine, unspecified, not intractable, without status migrainosus: Secondary | ICD-10-CM | POA: Insufficient documentation

## 2016-06-16 DIAGNOSIS — Z7982 Long term (current) use of aspirin: Secondary | ICD-10-CM | POA: Diagnosis not present

## 2016-06-16 DIAGNOSIS — Z8249 Family history of ischemic heart disease and other diseases of the circulatory system: Secondary | ICD-10-CM | POA: Insufficient documentation

## 2016-06-16 DIAGNOSIS — M1990 Unspecified osteoarthritis, unspecified site: Secondary | ICD-10-CM | POA: Insufficient documentation

## 2016-06-16 DIAGNOSIS — N925 Other specified irregular menstruation: Secondary | ICD-10-CM | POA: Insufficient documentation

## 2016-06-16 DIAGNOSIS — E162 Hypoglycemia, unspecified: Secondary | ICD-10-CM | POA: Diagnosis not present

## 2016-06-16 DIAGNOSIS — D649 Anemia, unspecified: Secondary | ICD-10-CM | POA: Diagnosis not present

## 2016-06-16 DIAGNOSIS — I10 Essential (primary) hypertension: Secondary | ICD-10-CM | POA: Diagnosis not present

## 2016-06-16 DIAGNOSIS — Z880 Allergy status to penicillin: Secondary | ICD-10-CM | POA: Insufficient documentation

## 2016-06-16 DIAGNOSIS — N946 Dysmenorrhea, unspecified: Secondary | ICD-10-CM | POA: Insufficient documentation

## 2016-06-16 HISTORY — PX: LAPAROSCOPIC OVARIAN CYSTECTOMY: SHX6248

## 2016-06-16 LAB — TYPE AND SCREEN
ABO/RH(D): O POS
Antibody Screen: NEGATIVE

## 2016-06-16 LAB — ABO/RH: ABO/RH(D): O POS

## 2016-06-16 LAB — PREGNANCY, URINE: Preg Test, Ur: NEGATIVE

## 2016-06-16 SURGERY — EXCISION, CYST, OVARY, LAPAROSCOPIC
Anesthesia: General

## 2016-06-16 MED ORDER — LACTATED RINGERS IR SOLN
Status: DC | PRN
  Administered 2016-06-16: 3000 mL

## 2016-06-16 MED ORDER — ONDANSETRON HCL 4 MG/2ML IJ SOLN: 4.0000 mg | Freq: Once | INTRAMUSCULAR | Status: DC | PRN

## 2016-06-16 MED ORDER — PROPOFOL 10 MG/ML IV BOLUS
INTRAVENOUS | Status: AC
  Filled 2016-06-16: qty 20

## 2016-06-16 MED ORDER — FENTANYL CITRATE (PF) 250 MCG/5ML IJ SOLN
INTRAMUSCULAR | Status: AC
  Filled 2016-06-16: qty 5

## 2016-06-16 MED ORDER — BUPIVACAINE HCL 0.25 % IJ SOLN
INTRAMUSCULAR | Status: DC | PRN
  Administered 2016-06-16: 5 mL
  Administered 2016-06-16: 20 mL

## 2016-06-16 MED ORDER — HYDROMORPHONE HCL 1 MG/ML IJ SOLN
INTRAMUSCULAR | Status: AC
  Filled 2016-06-16: qty 1

## 2016-06-16 MED ORDER — IBUPROFEN 800 MG PO TABS: 800.0000 mg | ORAL_TABLET | Freq: Three times a day (TID) | ORAL | 3 refills | Status: DC | PRN

## 2016-06-16 MED ORDER — KETOROLAC TROMETHAMINE 30 MG/ML IJ SOLN
INTRAMUSCULAR | Status: DC | PRN
  Administered 2016-06-16: 30 mg via INTRAVENOUS

## 2016-06-16 MED ORDER — LIDOCAINE HCL (CARDIAC) 20 MG/ML IV SOLN
INTRAVENOUS | Status: DC | PRN
  Administered 2016-06-16: 100 mg via INTRAVENOUS

## 2016-06-16 MED ORDER — HYDROCODONE-ACETAMINOPHEN 5-325 MG PO TABS: 1.0000 | ORAL_TABLET | Freq: Four times a day (QID) | ORAL | 0 refills | Status: DC | PRN

## 2016-06-16 MED ORDER — ONDANSETRON HCL 4 MG/2ML IJ SOLN
INTRAMUSCULAR | Status: AC
  Filled 2016-06-16: qty 2

## 2016-06-16 MED ORDER — OXYCODONE HCL 5 MG PO TABS: 5.0000 mg | ORAL_TABLET | Freq: Once | ORAL | Status: DC | PRN

## 2016-06-16 MED ORDER — DEXAMETHASONE SODIUM PHOSPHATE 10 MG/ML IJ SOLN
INTRAMUSCULAR | Status: AC
  Filled 2016-06-16: qty 1

## 2016-06-16 MED ORDER — DEXAMETHASONE SODIUM PHOSPHATE 10 MG/ML IJ SOLN
INTRAMUSCULAR | Status: DC | PRN
  Administered 2016-06-16: 10 mg via INTRAVENOUS

## 2016-06-16 MED ORDER — KETOROLAC TROMETHAMINE 30 MG/ML IJ SOLN
INTRAMUSCULAR | Status: AC
Start: 2016-06-16 — End: 2016-06-16
  Filled 2016-06-16: qty 1

## 2016-06-16 MED ORDER — PROMETHAZINE HCL 25 MG/ML IJ SOLN: 6.2500 mg | INTRAMUSCULAR | Status: DC | PRN

## 2016-06-16 MED ORDER — METHYLENE BLUE 0.5 % INJ SOLN
INTRAVENOUS | Status: AC
  Filled 2016-06-16: qty 10

## 2016-06-16 MED ORDER — FENTANYL CITRATE (PF) 100 MCG/2ML IJ SOLN
INTRAMUSCULAR | Status: AC
  Filled 2016-06-16: qty 2

## 2016-06-16 MED ORDER — LACTATED RINGERS IV SOLN
INTRAVENOUS | Status: DC
  Administered 2016-06-16 (×2): via INTRAVENOUS

## 2016-06-16 MED ORDER — FENTANYL CITRATE (PF) 100 MCG/2ML IJ SOLN: 25.0000 ug | INTRAMUSCULAR | Status: DC | PRN

## 2016-06-16 MED ORDER — ONDANSETRON HCL 4 MG/2ML IJ SOLN
INTRAMUSCULAR | Status: DC | PRN
  Administered 2016-06-16: 4 mg via INTRAVENOUS

## 2016-06-16 MED ORDER — SUGAMMADEX SODIUM 200 MG/2ML IV SOLN
INTRAVENOUS | Status: DC | PRN
  Administered 2016-06-16: 170 mg via INTRAVENOUS

## 2016-06-16 MED ORDER — MIDAZOLAM HCL 5 MG/5ML IJ SOLN
INTRAMUSCULAR | Status: DC | PRN
  Administered 2016-06-16: 2 mg via INTRAVENOUS

## 2016-06-16 MED ORDER — FENTANYL CITRATE (PF) 100 MCG/2ML IJ SOLN
INTRAMUSCULAR | Status: DC | PRN
  Administered 2016-06-16: 50 ug via INTRAVENOUS
  Administered 2016-06-16: 100 ug via INTRAVENOUS
  Administered 2016-06-16 (×2): 50 ug via INTRAVENOUS
  Administered 2016-06-16: 100 ug via INTRAVENOUS

## 2016-06-16 MED ORDER — ACETAMINOPHEN 325 MG PO TABS: 325.0000 mg | ORAL_TABLET | ORAL | Status: DC | PRN

## 2016-06-16 MED ORDER — BUPIVACAINE HCL (PF) 0.25 % IJ SOLN
INTRAMUSCULAR | Status: AC
  Filled 2016-06-16: qty 30

## 2016-06-16 MED ORDER — SCOPOLAMINE 1 MG/3DAYS TD PT72
1.0000 | MEDICATED_PATCH | Freq: Once | TRANSDERMAL | Status: DC
  Administered 2016-06-16: 1.5 mg via TRANSDERMAL

## 2016-06-16 MED ORDER — ROCURONIUM BROMIDE 100 MG/10ML IV SOLN
INTRAVENOUS | Status: DC | PRN
  Administered 2016-06-16: 40 mg via INTRAVENOUS
  Administered 2016-06-16: 10 mg via INTRAVENOUS

## 2016-06-16 MED ORDER — STERILE WATER FOR IRRIGATION IR SOLN
Status: DC | PRN
  Administered 2016-06-16: 20 mL

## 2016-06-16 MED ORDER — OXYCODONE HCL 5 MG/5ML PO SOLN: 5.0000 mg | Freq: Once | ORAL | Status: DC | PRN

## 2016-06-16 MED ORDER — MEPERIDINE HCL 25 MG/ML IJ SOLN: 6.2500 mg | INTRAMUSCULAR | Status: DC | PRN

## 2016-06-16 MED ORDER — ROCURONIUM BROMIDE 100 MG/10ML IV SOLN
INTRAVENOUS | Status: AC
  Filled 2016-06-16: qty 1

## 2016-06-16 MED ORDER — SUGAMMADEX SODIUM 200 MG/2ML IV SOLN
INTRAVENOUS | Status: AC
  Filled 2016-06-16: qty 2

## 2016-06-16 MED ORDER — MIDAZOLAM HCL 2 MG/2ML IJ SOLN
INTRAMUSCULAR | Status: AC
  Filled 2016-06-16: qty 2

## 2016-06-16 MED ORDER — ACETAMINOPHEN 160 MG/5ML PO SOLN: 325.0000 mg | ORAL | Status: DC | PRN

## 2016-06-16 MED ORDER — LIDOCAINE HCL (CARDIAC) 20 MG/ML IV SOLN
INTRAVENOUS | Status: AC
  Filled 2016-06-16: qty 5

## 2016-06-16 MED ORDER — HYDROMORPHONE HCL 1 MG/ML IJ SOLN
INTRAMUSCULAR | Status: DC | PRN
  Administered 2016-06-16: 1 mg via INTRAVENOUS

## 2016-06-16 MED ORDER — KETOROLAC TROMETHAMINE 30 MG/ML IJ SOLN: 30.0000 mg | Freq: Once | INTRAMUSCULAR | Status: DC

## 2016-06-16 MED ORDER — PROPOFOL 10 MG/ML IV BOLUS
INTRAVENOUS | Status: DC | PRN
Start: 2016-06-16 — End: 2016-06-16
  Administered 2016-06-16: 200 mg via INTRAVENOUS

## 2016-06-16 SURGICAL SUPPLY — 48 items
APPLICATOR ARISTA FLEXITIP XL (MISCELLANEOUS) ×3 IMPLANT
BENZOIN TINCTURE PRP APPL 2/3 (GAUZE/BANDAGES/DRESSINGS) ×3 IMPLANT
CABLE HIGH FREQUENCY MONO STRZ (ELECTRODE) ×3 IMPLANT
CATH ROBINSON RED A/P 16FR (CATHETERS) IMPLANT
CLOTH BEACON ORANGE TIMEOUT ST (SAFETY) ×3 IMPLANT
DERMABOND ADHESIVE PROPEN (GAUZE/BANDAGES/DRESSINGS) ×4
DERMABOND ADVANCED (GAUZE/BANDAGES/DRESSINGS) ×2
DERMABOND ADVANCED .7 DNX12 (GAUZE/BANDAGES/DRESSINGS) ×1 IMPLANT
DERMABOND ADVANCED .7 DNX6 (GAUZE/BANDAGES/DRESSINGS) ×2 IMPLANT
DISSECTOR BLUNT TIP ENDO 5MM (MISCELLANEOUS) ×3 IMPLANT
DRSG OPSITE 4X5.5 SM (GAUZE/BANDAGES/DRESSINGS) ×3 IMPLANT
DRSG OPSITE POSTOP 3X4 (GAUZE/BANDAGES/DRESSINGS) IMPLANT
DURAPREP 26ML APPLICATOR (WOUND CARE) ×3 IMPLANT
ELECT REM PT RETURN 9FT ADLT (ELECTROSURGICAL) ×3
ELECTRODE REM PT RTRN 9FT ADLT (ELECTROSURGICAL) ×1 IMPLANT
GLOVE BIO SURGEON STRL SZ 6.5 (GLOVE) ×2 IMPLANT
GLOVE BIO SURGEONS STRL SZ 6.5 (GLOVE) ×1
GLOVE BIOGEL PI IND STRL 7.0 (GLOVE) ×3 IMPLANT
GLOVE BIOGEL PI INDICATOR 7.0 (GLOVE) ×6
GOWN STRL REUS W/TWL LRG LVL3 (GOWN DISPOSABLE) ×6 IMPLANT
HEMOSTAT ARISTA ABSORB 3G PWDR (MISCELLANEOUS) ×6 IMPLANT
LIGASURE VESSEL 5MM BLUNT TIP (ELECTROSURGICAL) ×2 IMPLANT
NEEDLE INSUFFLATION 120MM (ENDOMECHANICALS) IMPLANT
NS IRRIG 1000ML POUR BTL (IV SOLUTION) ×3 IMPLANT
PACK LAPAROSCOPY BASIN (CUSTOM PROCEDURE TRAY) ×3 IMPLANT
PACK TRENDGUARD 450 HYBRID PRO (MISCELLANEOUS) ×1 IMPLANT
PACK TRENDGUARD 600 HYBRD PROC (MISCELLANEOUS) IMPLANT
POUCH SPECIMEN RETRIEVAL 10MM (ENDOMECHANICALS) IMPLANT
PROTECTOR NERVE ULNAR (MISCELLANEOUS) ×6 IMPLANT
SCISSORS LAP 5X35 DISP (ENDOMECHANICALS) ×3 IMPLANT
SET IRRIG TUBING LAPAROSCOPIC (IRRIGATION / IRRIGATOR) IMPLANT
SLEEVE XCEL OPT CAN 5 100 (ENDOMECHANICALS) ×3 IMPLANT
SOLUTION ELECTROLUBE (MISCELLANEOUS) IMPLANT
STOPCOCK 3 WAY MALE LL (IV SETS) ×2
STOPCOCK 3WAY MALE LL (IV SETS) ×1 IMPLANT
SUT MNCRL AB 4-0 PS2 18 (SUTURE) ×3 IMPLANT
SUT MON AB 4-0 PS1 27 (SUTURE) ×3 IMPLANT
SUT VICRYL 0 UR6 27IN ABS (SUTURE) ×3 IMPLANT
SYR 50ML SLIP (SYRINGE) ×3 IMPLANT
SYR 5ML LL (SYRINGE) ×3 IMPLANT
TOWEL OR 17X24 6PK STRL BLUE (TOWEL DISPOSABLE) ×6 IMPLANT
TRAY FOLEY CATH SILVER 14FR (SET/KITS/TRAYS/PACK) ×3 IMPLANT
TRENDGUARD 450 HYBRID PRO PACK (MISCELLANEOUS) ×3
TRENDGUARD 600 HYBRID PROC PK (MISCELLANEOUS)
TROCAR XCEL NON-BLD 11X100MML (ENDOMECHANICALS) IMPLANT
TROCAR XCEL NON-BLD 5MMX100MML (ENDOMECHANICALS) ×3 IMPLANT
WARMER LAPAROSCOPE (MISCELLANEOUS) ×3 IMPLANT
WATER STERILE IRR 1000ML POUR (IV SOLUTION) ×3 IMPLANT

## 2016-06-16 NOTE — Op Note (Signed)
OPERATIVE NOTE  Julie Kim  DOB:    08-15-1990  MRN:    [id number]  CSN:    [id number]  Date of Surgery:  06/16/2016  Preop Diagnosis: LEFT OVARIAN ENDOMETRIOMA PRIMARY INFERTILITY CHROMOTUBATION   Postop Diagnosis: Endometriosis and Pelvic Adhesive  Disease    Procedure: Operative Laparoscopy/ Lysis of Adhesions / Endometriosis fulguration/ chromopertubation  Anesthesia: GETA  Surgeon: Julie Kim, M.D.  Assistant: Julie Kim, M.D.  Disposition: The patient presents with the above-mentioned diagnosis. She understands the indications for surgical procedure.  She also understands the alternative treatment options. She accepts the risk of, but not limited to, anesthetic complications, bleeding, infections, and possible damage to the surrounding organs.  Indication: 26 yo with primary infertility and chronic pelvic pain, dysmenorrhea and dyspareunia  Findings: Exam under anesthesia: External vulva: Normal vaginal vault: normal cervix: normal Uterus: No palpable masses Adnexa: No palpable masses. Laparoscopic findings: uterus appears normal Right fallopian tube and ovary normal. Endometriotic implants along anterior broad ligament, posterior cul de sac. Severe adhesive disease from anterior abdominal wall to anterior portion of uterus. Endometriosis fluid present in these adhesions.  Left tube appear dilated and kinked. The left tube and ovary was stuck to posterior aspect of uterus as well as left pelvic side wall.  Sigmoid bowel adherent to posterior aspect of uterus. Appendix could not be visualized. Liver appears normal  Pathology: No Specimen  Fluids: 1800 mL  UOP: 400 mL  EBL: 30 mL  Complications: None  Procedure: The patient was taken to the operating room after the risks, benefits, alternatives, complications, treatment options, and expected outcomes were discussed with the patient. The patient verbalized understanding, the patient concurred with the  proposed plan and consent signed and witnessed. The patient was taken to the Operating Room, identified as Julie Kim and the procedure verified as operative laparoscopy possible laparoscopic ovarian cystectomy. A Time Out was held and the above information confirmed.  The patient was taken to the operating room after appropriate identification and placed on the operating table. After the attainment of adequate general anesthesia she was placed in the modified lithotomy position using Allen stirrups. Both upper extremities were padded and placed by her side. An examination under anesthesia was performed.  The abdomen was prepped with ChloraPrep. The perineum and vagina were prepped with multiple layers of Betadine.  The bladder was catheterized. The abdomen and perineum were draped as a sterile field. .  A single toothed tenaculum was placed on the anterior lip of the cervix, an acorn uterine manipulator was placed into the endometrial canal and fixed to the tenaculum. The surgeon re- gowned and gloved.    After infiltration with 0.25% Marcaine, a subumbilical incision was made using an 11 blade scalpel and the peritoneum was entered directly using a 5 mm Excel Visaport with 0 degree 5 mm laparoscope attached. Confirmation of entry into the peritoneum was confirmed with opening pressure of CO2 and direct visualization. Pneumoperitoneum was obtained using approximately 2 L of CO2 gas. The pelvic organs were inspected with findings as mentioned above. Patient was placed in trendelenburg and marcaine injected in the RLQ and a 5 mm incision was made and 5 mm trocar advanced into the intraabdominal cavity.  The same was done in the LLQ area. This was performed under direct video visualization. There was no noted injury with placement of any trochars.   The left tube and ovary were dissected off the pelvic side wall and freed from the posterior side of the  uterus with  a combination of blunt and sharp dissection  (using the monopolar cautery).  The epiploica of the large bowel was also bluntly dissected off the posterior aspect of the uterus.  Monopolar cautery was used to fulgurate a purple endometriotic implant along anterior broad ligament.   Chromopertubation was performed by injection Methylene blue diluted in Normal saline was administered through the [employer] manipulator by the assisting nurse.  At first, there was no spill of dye from either tube but after a few minutes there was spill of dye bilaterally. Copious irrigation was carried out and all operative areas noted to be hemostatic.  Marcaine was placed in the pelvis for post operative pain. Monopolar cautery and Darvin Neighboursristra was placed over dissection bed assure hemostasis..   All trochars were then removed from the peritoneal cavity under direct visualization as the CO2 was allowed to escape. TAll skin incisions were closed with subcuticular sutures of 3-0 Monocryl then covered with Dermabond.   The uterine manipulator was removed as well as the Foley catheter. The patient was awakened from general anesthesia and taken to the recovery room in satisfactory condition having tolerated the procedure well with sponge and instrument counts correct. It was anticipated that she would be discharged home later that afternoon.  Julie Kim Julie Kim

## 2016-06-16 NOTE — Transfer of Care (Signed)
Immediate Anesthesia Transfer of Care Note  Patient: Julie Kim  Procedure(s) Performed: Procedure(s): LAPAROSCOPIC OVARIAN CYSTECTOMY (N/A)  Patient Location: PACU  Anesthesia Type:General  Level of Consciousness: awake, alert  and oriented  Airway & Oxygen Therapy: Patient Spontanous Breathing and Patient connected to nasal cannula oxygen  Post-op Assessment: Report given to RN and Post -op Vital signs reviewed and stable  Post vital signs: Reviewed and stable  Last Vitals:  Hr 89 Bp 109/57 Resp 23 O2 sat 93% on 2 L Temp 98.2  Last Pain:  Vitals:   06/16/16 0605  TempSrc: Oral      Patients Stated Pain Goal: 3 (06/16/16 [id number])  Complications: No apparent anesthesia complications

## 2016-06-16 NOTE — H&P (Signed)
Julie Kim is an 26 y.o. female G0 married female presents as a NEW GYN patient for her well woman visit. Pt has been trying to conceive for 2 years. Never had a positive pregnancy test. Pt has not been doing ovulation kits because her menses is irregular. She has episodes of amenorrhea, where she will miss a month. Her menses tends to be heavy lasting 7 days. She does have dysmenorrhea, she denies dyspareunia or dyschezia.    Pertinent Gynecological History: Menses: irregular occurring approximately every 30-60 days without intermenstrual spotting and with severe dysmenorrhea Bleeding: dysfunctional uterine bleeding Contraception:none DES exposure: denies Blood transfusions: none Sexually transmitted diseases: no past history Previous GYN Procedures: none  Last mammogram: n/a  Last pap: normal Date: 2017 OB History: G0,    Menstrual History: Menarche age: 46 Patient's last menstrual period was 06/16/2016.    Past Medical History:  Diagnosis Date  . Anemia   . Anxiety   . Arthritis   . Headache    Migraines  . Hypertension    while taking an anxiety pill, no longer taking that medication  . Hypoglycemia   . Low iron     Past Surgical History:  Procedure Laterality Date  . none      Family History  Problem Relation Age of Onset  . Heart disease Brother 0    valve doesn't open and close properl  . Heart disease Paternal Uncle     Social History:  reports that she has never smoked. She has never used smokeless tobacco. She reports that she does not drink alcohol or use drugs.  Allergies:  Allergies  Allergen Reactions  . Penicillins Hives    Has patient had a PCN reaction causing immediate rash, facial/tongue/throat swelling, SOB or lightheadedness with hypotension: Yes Has patient had a PCN reaction causing severe rash involving mucus membranes or skin necrosis: No Has patient had a PCN reaction that required hospitalization No Has patient had a PCN reaction  occurring within the last 10 years: No If all of the above answers are "NO", then may proceed with Cephalosporin use.     Prescriptions Prior to Admission  Medication Sig Dispense Refill Last Dose  . Acetaminophen-Caff-Pyrilamine (MIDOL COMPLETE) 500-60-15 MG TABS Take 1-2 tablets by mouth every 6 (six) hours as needed (for menstrual pain.).   06/15/2016 at Unknown time  . APAP-Pamabrom-Pyrilamine (PAMPRIN MAX PAIN FORMULA) 500-25-15 MG TABS Take 1-2 tablets by mouth every 6 (six) hours as needed (for menstrual pain).   Past Month at Unknown time  . aspirin-acetaminophen-caffeine (EXCEDRIN MIGRAINE) 250-250-65 MG tablet Take 2 tablets by mouth 2 (two) times daily as needed for headache.   Past Week at Unknown time  . doxycycline (VIBRAMYCIN) 100 MG capsule Take 1 capsule (100 mg total) by mouth 2 (two) times daily. (Patient not taking: Reported on 06/01/2016) 20 capsule 0 Not Taking at Unknown time  . naproxen sodium (ANAPROX) 220 MG tablet Take 220 mg by mouth 2 (two) times daily as needed (for pain.).   More than a month at Unknown time  . triamcinolone cream (KENALOG) 0.1 % Apply 1 application topically 3 (three) times daily. Avoid face and genitalia (Patient not taking: Reported on 06/01/2016) 45 g 0 Not Taking at Unknown time    Review of Systems  Constitutional: Negative.   Eyes: Negative.   Gastrointestinal: Positive for abdominal pain.  All other systems reviewed and are negative.   Blood pressure (!) 128/94, pulse 86, temperature 97.7 F (36.5 C), temperature  source Oral, resp. rate 16, last menstrual period 06/16/2016, SpO2 100 %. Physical Exam  Genitourinary:  Genitourinary Comments: Uterine and adnexal tenderness bilaterally    Results for orders placed or performed during the hospital encounter of 06/16/16 (from the past 24 hour(s))  Pregnancy, urine     Status: None   Collection Time: 06/16/16  6:00 AM  Result Value Ref Range   Preg Test, Ur NEGATIVE NEGATIVE  Type and  screen     Status: None   Collection Time: 06/16/16  6:08 AM  Result Value Ref Range   ABO/RH(D) O POS    Antibody Screen NEG    Sample Expiration 06/19/2016     No results found.  Assessment/Plan: 26 yo with e/o of a possible endometrioma on pelvic ultrasound today.       Discussed with patient endometriosis. We discussed that endometriosis is defined as the presence of endometrial glands and stroma at extra uterine sites. These ectopic endometrial implants are usually located in the pelvis, but can occur nearly anywhere in the body. Endometriosis is a common, benign, chronic, estrogen-dependent disorder. It can be associated with many distressing and debilitating symptoms, such as pelvic pain, severe dysmenorrhea, dyspareunia and infertility, or it may be asymptomatic, and incidentally discovered at laparoscopy or exploratory surgery. We are completely unsure of the etiology of endometriosis however there is the implantation theory proposes that endometrial cells shed into the uterus during menstruation are transported through the fallopian tubes (referred to as retrograde menstruation), thereby gaining access to, and implanting on, pelvic structures.   We also discussed that a wide variety of disorders share one or more of the clinical features of endometriosis:   -Pelvic pain may be caused by many conditions, including: ectopic pregnancy, pelvic inflammatory disease, interstitial cystitis, adenomyosis, ovarian neoplasms, pelvic adhesions, irritable bowel syndrome, colon cancer, diverticular disease  The location and character of the pain in dyspareunia helps to determine the etiology. Endometriosis is characterized by deep dyspareunia.  We discussed management of her pelvic pain and the possibility of endometriosis. We discussed empiric medical therapy with nonsteroidal anti-inflammatory drugs, oral contraceptives, or GnRH agonists may be used in women with pelvic pain and suspected  endometriosis, prior to establishing the diagnosis surgically. This may provide sufficient pain relief and avoid a laparoscopy. There is insufficient long-term follow-up of patients managed initially with medical therapy to assess symptom recurrence or the eventual need for surgical intervention.    A diagnostic laparoscopy can be performed to establish the presence of endometriosis which provides an opportunity for ablation or excision of implants and adhesions, thus potentially preventing or delaying disease or symptom progression. This approach should be considered in patients with suspected advanced stages of disease (i.e., endometriomas), severe pain, or refractory pain to conservative pain management.  We will proceed with a diagnostic laparoscopy, possible left ovarian cystectomy, chromopertubation. </DIV>  Essie HartINN, Karsten Vaughn STACIA 06/16/2016, 7:19 AM

## 2016-06-16 NOTE — Discharge Instructions (Signed)

## 2016-06-16 NOTE — OR Nursing (Signed)
Called Dr. Mora ApplPinn at [phone].

## 2016-06-16 NOTE — Anesthesia Procedure Notes (Signed)
Procedure Name: Intubation Date/Time: 06/16/2016 7:36 AM Performed by: Junious SilkGILBERT, Ramiah Helfrich Pre-anesthesia Checklist: Patient identified, Emergency Drugs available, Suction available, Patient being monitored and Timeout performed Patient Re-evaluated:Patient Re-evaluated prior to inductionOxygen Delivery Method: Circle system utilized Preoxygenation: Pre-oxygenation with 100% oxygen Intubation Type: IV induction Ventilation: Mask ventilation without difficulty Laryngoscope Size: Miller and 2 Grade View: Grade I Tube type: Oral Tube size: 7.0 mm Number of attempts: 1 Airway Equipment and Method: Stylet Placement Confirmation: positive ETCO2,  CO2 detector,  breath sounds checked- equal and bilateral and ETT inserted through vocal cords under direct vision Secured at: 22 cm Tube secured with: Tape Dental Injury: Teeth and Oropharynx as per pre-operative assessment

## 2016-06-18 NOTE — Anesthesia Postprocedure Evaluation (Signed)
Anesthesia Post Note  Patient: Julie Kim  Procedure(s) Performed: Procedure(s) (LRB): LAPAROSCOPIC OVARIAN CYSTECTOMY (N/A)  Patient location during evaluation: PACU Anesthesia Type: General Level of consciousness: awake Pain management: pain level controlled Vital Signs Assessment: post-procedure vital signs reviewed and stable Respiratory status: spontaneous breathing Cardiovascular status: stable Postop Assessment: no signs of nausea or vomiting Anesthetic complications: no        Last Vitals:  Vitals:   06/16/16 1005 06/16/16 1050  BP: 124/70 124/78  Pulse: 80 72  Resp: 18 20  Temp: 37 C     Last Pain:  Vitals:   06/16/16 1050  TempSrc:   PainSc: 3    Pain Goal: Patients Stated Pain Goal: 3 (06/16/16 0605)               Davyn Morandi JR,JOHN Susann GivensFRANKLIN

## 2016-06-19 ENCOUNTER — Encounter (HOSPITAL_COMMUNITY): Payer: Self-pay | Admitting: Obstetrics & Gynecology

## 2016-10-02 ENCOUNTER — Ambulatory Visit (INDEPENDENT_AMBULATORY_CARE_PROVIDER_SITE_OTHER): Payer: BLUE CROSS/BLUE SHIELD | Admitting: Family Medicine

## 2016-10-02 ENCOUNTER — Encounter: Payer: Self-pay | Admitting: Family Medicine

## 2016-10-02 VITALS — BP 131/87 | HR 118 | Temp 100.9°F | Ht 62.0 in | Wt 183.8 lb

## 2016-10-02 DIAGNOSIS — J029 Acute pharyngitis, unspecified: Secondary | ICD-10-CM

## 2016-10-02 MED ORDER — AZITHROMYCIN 250 MG PO TABS: ORAL_TABLET | ORAL | 0 refills | Status: DC

## 2016-10-02 NOTE — Patient Instructions (Signed)
Great to meet you!   Strep Throat Strep throat is an infection of the throat. It is caused by germs. Strep throat spreads from person to person because of coughing, sneezing, or close contact. Follow these instructions at home: Medicines  Take over-the-counter and prescription medicines only as told by your doctor.  Take your antibiotic medicine as told by your doctor. Do not stop taking the medicine even if you feel better.  Have family members who also have a sore throat or fever go to a doctor. Eating and drinking  Do not share food, drinking cups, or personal items.  Try eating soft foods until your sore throat feels better.  Drink enough fluid to keep your pee (urine) clear or pale yellow. General instructions  Rinse your mouth (gargle) with a salt-water mixture 3-4 times per day or as needed. To make a salt-water mixture, stir -1 tsp of salt into 1 cup of warm water.  Make sure that all people in your house wash their hands well.  Rest.  Stay home from school or work until you have been taking antibiotics for 24 hours.  Keep all follow-up visits as told by your doctor. This is important. Contact a doctor if:  Your neck keeps getting bigger.  You get a rash, cough, or earache.  You cough up thick liquid that is green, yellow-brown, or bloody.  You have pain that does not get better with medicine.  Your problems get worse instead of getting better.  You have a fever. Get help right away if:  You throw up (vomit).  You get a very bad headache.  You neck hurts or it feels stiff.  You have chest pain or you are short of breath.  You have drooling, very bad throat pain, or changes in your voice.  Your neck is swollen or the skin gets red and tender.  Your mouth is dry or you are peeing less than normal.  You keep feeling more tired or it is hard to wake up.  Your joints are red or they hurt. This information is not intended to replace advice given to  you by your health care provider. Make sure you discuss any questions you have with your health care provider. Document Released: 10/25/2007 Document Revised: 01/05/2016 Document Reviewed: 08/31/2014 Elsevier Interactive Patient Education  2017 [employer] Inc[employer]  

## 2016-10-02 NOTE — Progress Notes (Signed)
   HPI  Patient presents today here with sore throat.  Patient explains that she's had congestion, mild sore throat, swollen glands for about 2 weeks. She states that the pain radiates from her bilateral anterior cervical lymph node area up to her years.  Normal hearing. She has no shortness of breath, severe cough, or chest pain. She's tolerating food and fluid like usual.   PMH: Smoking status noted ROS: Per HPI  Objective: BP 131/87   Pulse (!) 118   Temp (!) 100.9 F (38.3 C) (Oral)   Ht 5\' 2"  (1.575 m)   Wt 183 lb 12.8 oz (83.4 kg)   BMI 33.62 kg/m  Gen: NAD, alert, cooperative with exam HEENT: NCAT, oropharynx with enlarged tonsils symmetrically bilaterally, TMs normal bilaterally, oropharynx moist Neck: Bilateral tender lymph nodes in the anterior cervical chain CV: RRR, good S1/S2, no murmur Resp: CTABL, no wheezes, non-labored Ext: No edema, warm Neuro: Alert and oriented, No gross deficits  Assessment and plan:  # Pharyngitis Most likely strep pharyngitis Patient with swollen lymph nodes, enlarged tonsils and fever on exam Penicillin allergy, azithromycin given Return to clinic with any concerns    Meds ordered this encounter  Medications  . azithromycin (ZITHROMAX) 250 MG tablet    Sig: Take 2 tablets on day 1 and 1 tablet daily after that    Dispense:  6 tablet    Refill:  0    Murtis SinkSam Shemia Bevel, MD [facility] 10/02/2016, 2:24 PM

## 2016-10-09 ENCOUNTER — Telehealth: Payer: Self-pay | Admitting: Family Medicine

## 2016-10-09 DIAGNOSIS — E669 Obesity, unspecified: Secondary | ICD-10-CM | POA: Insufficient documentation

## 2016-10-09 DIAGNOSIS — E66811 Obesity, class 1: Secondary | ICD-10-CM | POA: Insufficient documentation

## 2016-10-09 NOTE — Telephone Encounter (Signed)
Pt aware.

## 2016-10-09 NOTE — Telephone Encounter (Signed)
Lab orders placed.   Murtis SinkSam Pearl Bents, MD [facility] 10/09/2016, 11:55 AM

## 2016-10-10 ENCOUNTER — Other Ambulatory Visit: Payer: BLUE CROSS/BLUE SHIELD

## 2016-10-10 DIAGNOSIS — E669 Obesity, unspecified: Secondary | ICD-10-CM

## 2016-10-10 DIAGNOSIS — E66811 Obesity, class 1: Secondary | ICD-10-CM

## 2016-10-11 LAB — CBC WITH DIFFERENTIAL/PLATELET
Basophils Absolute: 0 10*3/uL (ref 0.0–0.2)
Basos: 0 %
EOS (ABSOLUTE): 0.1 10*3/uL (ref 0.0–0.4)
Eos: 2 %
Hematocrit: 31.9 % — ABNORMAL LOW (ref 34.0–46.6)
Hemoglobin: 9.6 g/dL — ABNORMAL LOW (ref 11.1–15.9)
Immature Grans (Abs): 0 10*3/uL (ref 0.0–0.1)
Immature Granulocytes: 0 %
Lymphocytes Absolute: 1.9 10*3/uL (ref 0.7–3.1)
Lymphs: 40 %
MCH: 23.9 pg — ABNORMAL LOW (ref 26.6–33.0)
MCHC: 30.1 g/dL — ABNORMAL LOW (ref 31.5–35.7)
MCV: 79 fL (ref 79–97)
Monocytes Absolute: 0.2 10*3/uL (ref 0.1–0.9)
Monocytes: 3 %
Neutrophils Absolute: 2.5 10*3/uL (ref 1.4–7.0)
Neutrophils: 55 %
Platelets: 373 10*3/uL (ref 150–379)
RBC: 4.02 x10E6/uL (ref 3.77–5.28)
RDW: 16 % — ABNORMAL HIGH (ref 12.3–15.4)
WBC: 4.7 10*3/uL (ref 3.4–10.8)

## 2016-10-11 LAB — CMP14+EGFR
ALT: 29 [IU]/L (ref 0–32)
AST: 34 [IU]/L (ref 0–40)
Albumin/Globulin Ratio: 1.2 (ref 1.2–2.2)
Albumin: 4.1 g/dL (ref 3.5–5.5)
Alkaline Phosphatase: 89 [IU]/L (ref 39–117)
BUN/Creatinine Ratio: 15 (ref 9–23)
BUN: 11 mg/dL (ref 6–20)
Bilirubin Total: 0.3 mg/dL (ref 0.0–1.2)
CO2: 25 mmol/L (ref 18–29)
Calcium: 8.9 mg/dL (ref 8.7–10.2)
Chloride: 102 mmol/L (ref 96–106)
Creatinine, Ser: 0.73 mg/dL (ref 0.57–1.00)
GFR calc Af Amer: 132 mL/min/{1.73_m2} (ref >59)
GFR calc non Af Amer: 115 mL/min/{1.73_m2} (ref >59)
Globulin, Total: 3.5 g/dL (ref 1.5–4.5)
Glucose: 83 mg/dL (ref 65–99)
Potassium: 3.8 mmol/L (ref 3.5–5.2)
Sodium: 139 mmol/L (ref 134–144)
Total Protein: 7.6 g/dL (ref 6.0–8.5)

## 2016-10-11 LAB — LIPID PANEL
Chol/HDL Ratio: 5.2 {ratio} — ABNORMAL HIGH (ref 0.0–4.4)
Cholesterol, Total: 161 mg/dL (ref 100–199)
HDL: 31 mg/dL — ABNORMAL LOW (ref >39)
LDL Calculated: 80 mg/dL (ref 0–99)
Triglycerides: 250 mg/dL — ABNORMAL HIGH (ref 0–149)
VLDL Cholesterol Cal: 50 mg/dL — ABNORMAL HIGH (ref 5–40)

## 2016-10-11 LAB — TSH: TSH: 1.81 u[IU]/mL (ref 0.450–4.500)

## 2016-10-12 LAB — FERRITIN: Ferritin: 51 ng/mL (ref 15–150)

## 2016-10-12 LAB — SPECIMEN STATUS REPORT

## 2016-10-17 ENCOUNTER — Encounter: Payer: Self-pay | Admitting: Family Medicine

## 2017-02-02 ENCOUNTER — Ambulatory Visit: Payer: BLUE CROSS/BLUE SHIELD | Admitting: Family Medicine

## 2017-02-05 ENCOUNTER — Encounter: Payer: Self-pay | Admitting: Family Medicine

## 2017-02-05 ENCOUNTER — Ambulatory Visit (INDEPENDENT_AMBULATORY_CARE_PROVIDER_SITE_OTHER): Payer: BLUE CROSS/BLUE SHIELD

## 2017-02-05 ENCOUNTER — Ambulatory Visit (INDEPENDENT_AMBULATORY_CARE_PROVIDER_SITE_OTHER): Payer: BLUE CROSS/BLUE SHIELD | Admitting: Family Medicine

## 2017-02-05 VITALS — BP 124/89 | HR 105 | Temp 97.8°F | Ht 62.0 in | Wt 191.0 lb

## 2017-02-05 DIAGNOSIS — M25522 Pain in left elbow: Secondary | ICD-10-CM

## 2017-02-05 DIAGNOSIS — R0789 Other chest pain: Secondary | ICD-10-CM

## 2017-02-05 MED ORDER — PREDNISONE 10 MG PO TABS: ORAL_TABLET | ORAL | 0 refills | Status: DC

## 2017-02-05 NOTE — Addendum Note (Signed)
Addended by: Magdalene River on: 02/05/2017 04:18 PM   Modules accepted: Orders

## 2017-02-05 NOTE — Progress Notes (Signed)
Chief Complaint  Patient presents with  . left elbow pain    HPI  Patient presents today for 1 month of left elbow pain. It is an ache that burns at times. It  Shoots up her arm at times. It goes into the left chest as well. It is not clearly exertional.However, she has a brother who had a congenital heart defectthat required 3 surgeries. She wears an  apple watch that frequently tells her that her heart rate is rapid.She would like to have an evaluation for her heart. Also if it is not her heart how should her elbow be treated is of concern to her. She denies injury.  PMH: Smoking status noted ROS: Review of Systems  Constitutional: Negative for activity change, appetite change and fever.  HENT: Negative for congestion, rhinorrhea and sore throat.   Eyes: Negative for visual disturbance.  Respiratory: Negative for cough and shortness of breath.   Cardiovascular: Positive for chest pain and palpitations.  Gastrointestinal: Negative for abdominal pain, diarrhea and nausea.  Genitourinary: Negative for dysuria.  Musculoskeletal: Positive for arthralgias (left elbow). Negative for myalgias.     Objective: BP 124/89 (BP Location: Left Arm)   Pulse (!) 105   Temp 97.8 F (36.6 C) (Oral)   Ht  (1.575 m)   Wt 191 lb (86.6 kg)   LMP 01/06/2017   BMI 34.93 kg/m  Gen: NAD, alert, cooperative with exam HEENT: NCAT, EOMI, PERRL CV: RRR, good S1/S2, no murmur Resp: CTABL, no wheezes, non-labored Abd: SNTND, BS present, no guarding or organomegaly Ext: No edema, warm. Tender at left elbow, olecranon and extensor origin for palpation. No erythema. FROM Neuro: Alert and oriented, No gross deficits  Assessment and plan:  1. Atypical chest pain   2. Left elbow pain     Meds ordered this encounter  Medications  . clomiPHENE (CLOMID) 50 MG tablet    Sig: clomiphene citrate 50 mg tablet  Take 1 tablet by oral route as directed for 5 days.    Orders Placed This Encounter   Procedures  . DG Chest 2 View    Standing Status:   Future    Number of Occurrences:   1    Standing Expiration Date:   04/07/2018    Order Specific Question:   Reason for Exam (SYMPTOM  OR DIAGNOSIS REQUIRED)    Answer:   left arm pain, chest pain and elevated HR    Order Specific Question:   Is the patient pregnant?    Answer:   No    Order Specific Question:   Preferred imaging location?    Answer:   Internal  . EKG 12-Lead  EKG - NSR. No axis deviation, no ischemic changes, nml intervals. CXR - NAD  Prednisone taper for elbow taper from 5 to one 10 mg tabs q 3 d  Follow up as needed.  Mechele Claude, MD

## 2017-03-09 ENCOUNTER — Emergency Department (HOSPITAL_COMMUNITY)
Admission: EM | Admit: 2017-03-09 | Discharge: 2017-03-09 | Disposition: A | Discharge status: Home / Self Care | Payer: BLUE CROSS/BLUE SHIELD | Attending: Emergency Medicine | Admitting: Emergency Medicine

## 2017-03-09 ENCOUNTER — Encounter (HOSPITAL_COMMUNITY): Payer: Self-pay | Admitting: Emergency Medicine

## 2017-03-09 ENCOUNTER — Emergency Department (HOSPITAL_COMMUNITY): Payer: BLUE CROSS/BLUE SHIELD

## 2017-03-09 DIAGNOSIS — N939 Abnormal uterine and vaginal bleeding, unspecified: Secondary | ICD-10-CM | POA: Insufficient documentation

## 2017-03-09 DIAGNOSIS — R110 Nausea: Secondary | ICD-10-CM | POA: Diagnosis not present

## 2017-03-09 DIAGNOSIS — M545 Low back pain: Secondary | ICD-10-CM | POA: Insufficient documentation

## 2017-03-09 DIAGNOSIS — I10 Essential (primary) hypertension: Secondary | ICD-10-CM | POA: Insufficient documentation

## 2017-03-09 DIAGNOSIS — R109 Unspecified abdominal pain: Secondary | ICD-10-CM | POA: Diagnosis present

## 2017-03-09 DIAGNOSIS — R42 Dizziness and giddiness: Secondary | ICD-10-CM | POA: Insufficient documentation

## 2017-03-09 DIAGNOSIS — Z8742 Personal history of other diseases of the female genital tract: Secondary | ICD-10-CM | POA: Diagnosis not present

## 2017-03-09 HISTORY — DX: Endometriosis, unspecified: N80.9

## 2017-03-09 LAB — CBC WITH DIFFERENTIAL/PLATELET
Basophils Absolute: 0 10*3/uL (ref 0.0–0.1)
Basophils Relative: 0 %
Eosinophils Absolute: 0 10*3/uL (ref 0.0–0.7)
Eosinophils Relative: 0 %
HCT: 35.7 % — ABNORMAL LOW (ref 36.0–46.0)
Hemoglobin: 11.5 g/dL — ABNORMAL LOW (ref 12.0–15.0)
Lymphocytes Relative: 13 %
Lymphs Abs: 0.8 10*3/uL (ref 0.7–4.0)
MCH: 24.3 pg — ABNORMAL LOW (ref 26.0–34.0)
MCHC: 32.2 g/dL (ref 30.0–36.0)
MCV: 75.5 fL — ABNORMAL LOW (ref 78.0–100.0)
Monocytes Absolute: 0.1 10*3/uL (ref 0.1–1.0)
Monocytes Relative: 2 %
Neutro Abs: 5.5 10*3/uL (ref 1.7–7.7)
Neutrophils Relative %: 85 %
Platelets: 353 10*3/uL (ref 150–400)
RBC: 4.73 MIL/uL (ref 3.87–5.11)
RDW: 14.8 % (ref 11.5–15.5)
WBC: 6.4 10*3/uL (ref 4.0–10.5)

## 2017-03-09 LAB — URINALYSIS, ROUTINE W REFLEX MICROSCOPIC
Bacteria, UA: NONE SEEN
Bilirubin Urine: NEGATIVE
Bilirubin Urine: NEGATIVE
Glucose, UA: NEGATIVE mg/dL
Glucose, UA: NEGATIVE mg/dL
Ketones, ur: 80 mg/dL — AB
Leukocytes, UA: NEGATIVE
Nitrite: NEGATIVE
Nitrite: NEGATIVE
Protein, ur: NEGATIVE mg/dL
Specific Gravity, Urine: 1.027 (ref 1.005–1.030)
pH: 5 (ref 5.0–8.0)

## 2017-03-09 LAB — URINALYSIS, MICROSCOPIC (REFLEX)
Squamous Epithelial / HPF: NONE SEEN
WBC, UA: NONE SEEN WBC/hpf (ref 0–5)

## 2017-03-09 LAB — WET PREP, GENITAL
Clue Cells Wet Prep HPF POC: NONE SEEN
Sperm: NONE SEEN
Trich, Wet Prep: NONE SEEN
WBC, Wet Prep HPF POC: NONE SEEN
Yeast Wet Prep HPF POC: NONE SEEN

## 2017-03-09 LAB — BASIC METABOLIC PANEL
Anion gap: 11 (ref 5–15)
BUN: 11 mg/dL (ref 6–20)
CO2: 21 mmol/L — ABNORMAL LOW (ref 22–32)
Calcium: 9.1 mg/dL (ref 8.9–10.3)
Chloride: 106 mmol/L (ref 101–111)
Creatinine, Ser: 0.57 mg/dL (ref 0.44–1.00)
GFR calc Af Amer: >60 mL/min (ref >60)
GFR calc non Af Amer: >60 mL/min (ref >60)
Glucose, Bld: 111 mg/dL — ABNORMAL HIGH (ref 65–99)
Potassium: 3.9 mmol/L (ref 3.5–5.1)
Sodium: 138 mmol/L (ref 135–145)

## 2017-03-09 LAB — HCG, QUANTITATIVE, PREGNANCY: hCG, Beta Chain, Quant, S: <1 m[IU]/mL (ref <5)

## 2017-03-09 LAB — ABO/RH: ABO/RH(D): O POS

## 2017-03-09 LAB — PREGNANCY, URINE: Preg Test, Ur: NEGATIVE

## 2017-03-09 MED ORDER — SODIUM CHLORIDE 0.9 % IV BOLUS (SEPSIS)
1000.0000 mL | Freq: Once | INTRAVENOUS | Status: AC
  Administered 2017-03-09: 1000 mL via INTRAVENOUS

## 2017-03-09 MED ORDER — ESTROGENS CONJUGATED 25 MG IJ SOLR
25.0000 mg | Freq: Once | INTRAMUSCULAR | Status: AC
  Administered 2017-03-09: 25 mg via INTRAVENOUS
  Filled 2017-03-09 (×2): qty 25

## 2017-03-09 MED ORDER — MEGESTROL ACETATE 40 MG PO TABS
120.0000 mg | ORAL_TABLET | Freq: Every day | ORAL | Status: DC
  Administered 2017-03-09: 120 mg via ORAL
  Filled 2017-03-09 (×5): qty 3

## 2017-03-09 MED ORDER — MEGESTROL ACETATE 40 MG PO TABS: 120.0000 mg | ORAL_TABLET | Freq: Every day | ORAL | 0 refills | Status: DC

## 2017-03-09 NOTE — ED Notes (Signed)
Patient transported to Ultrasound 

## 2017-03-09 NOTE — ED Notes (Signed)
Terrible per pt abd pain last pm   Called her PCP this am who has sent to the ED for eval  Pt ambulates erect with relaxed facial features

## 2017-03-09 NOTE — ED Provider Notes (Signed)
Emergency Department Provider Note   I have reviewed the triage vital signs and the nursing notes.   HISTORY  Chief Complaint Abdominal Pain    HPI Julie Kim is a 26 y.o. female with a past medical history of anemia, anxiety, endometriosis and low iron presents to the emergency department today secondary to vaginal bleeding.  Patient states that she is on Clomid to help with getting pregnant and recently had a laparoscopic operation for endometriosis.  Patient states that this is her normal time for her.  And her periods are normally heavy but at this time she said multiple large clots associated with some nausea and some dizziness.  This is happened multiple times in the last 24 hours and she talked to her gynecologist and they sent her here for further evaluation.  Does not know if she is pregnant.  No trauma.  She does have some associated back pain.  She does have some increased urination.  No vomiting, diarrhea or constipation.  No other associated modifying symptoms.  No rashes.  No recent fevers.  Not tried anything at home for the symptoms.   Past Medical History:  Diagnosis Date  . Anemia   . Anxiety   . Arthritis   . Endometriosis   . Headache    Migraines  . Hypertension    while taking an anxiety pill, no longer taking that medication  . Hypoglycemia   . Low iron     Patient Active Problem List   Diagnosis Date Noted  . Obesity (BMI 30.0-34.9) 10/09/2016    Past Surgical History:  Procedure Laterality Date  . LAPAROSCOPIC OVARIAN CYSTECTOMY N/A 06/16/2016   Procedure: LAPAROSCOPIC OVARIAN CYSTECTOMY;  Surgeon: Essie Hart, MD;  Location: [facility];  Service: Gynecology;  Laterality: N/A;  . none      Current Outpatient Rx  . Order #: [id number] Class: Historical Med  . Order #: [id number] Class: Print    Allergies Penicillins  Family History  Problem Relation Age of Onset  . Heart disease Brother 0       valve doesn't open and close properl  . Heart  disease Paternal Uncle     Social History Social History  Substance Use Topics  . Smoking status: Never Smoker  . Smokeless tobacco: Never Used  . Alcohol use No    Review of Systems  All other systems negative except as documented in the HPI. All pertinent positives and negatives as reviewed in the HPI. ____________________________________________   PHYSICAL EXAM:  VITAL SIGNS: ED Triage Vitals  Enc Vitals Group     BP 03/09/17 1046 (!) 136/92     Pulse Rate 03/09/17 1046 95     Resp 03/09/17 1046 18     Temp 03/09/17 1046 98.4 F (36.9 C)     Temp Source 03/09/17 1046 Oral     SpO2 03/09/17 1046 98 %     Weight 03/09/17 1048 181 lb (82.1 kg)     Height 03/09/17 1048 5\' 2"  (1.575 m)     Head Circumference --      Peak Flow --      Pain Score 03/09/17 1048 4     Pain Loc --      Pain Edu? --      Excl. in GC? --     Constitutional: Alert and oriented. Well appearing and in no acute distress. Eyes: Conjunctivae are normal. PERRL. EOMI. Head: Atraumatic. Nose: No congestion/rhinnorhea. Mouth/Throat: Mucous membranes are moist.  Oropharynx non-erythematous. Neck:  No stridor.  No meningeal signs.   Cardiovascular: Normal rate, regular rhythm. Good peripheral circulation. Grossly normal heart sounds.   Respiratory: Normal respiratory effort.  No retractions. Lungs CTAB. Gastrointestinal: Soft and nontender. No distention.  Genitourinary: oozing from cervix, no obvious trauma Musculoskeletal: No lower extremity tenderness nor edema. No gross deformities of extremities. Neurologic:  Normal speech and language. No gross focal neurologic deficits are appreciated.  Skin:  Skin is warm, dry and intact. No rash noted.   ____________________________________________   LABS (all labs ordered are listed, but only abnormal results are displayed)  Labs Reviewed  CBC WITH DIFFERENTIAL/PLATELET - Abnormal; Notable for the following:       Result Value   Hemoglobin 11.5 (*)      HCT 35.7 (*)    MCV 75.5 (*)    MCH 24.3 (*)    All other components within normal limits  BASIC METABOLIC PANEL - Abnormal; Notable for the following:    CO2 21 (*)    Glucose, Bld 111 (*)    All other components within normal limits  URINALYSIS, ROUTINE W REFLEX MICROSCOPIC - Abnormal; Notable for the following:    Color, Urine RED (*)    APPearance TURBID (*)    Hgb urine dipstick   (*)    Value: TEST NOT REPORTED DUE TO COLOR INTERFERENCE OF URINE PIGMENT   Ketones, ur   (*)    Value: TEST NOT REPORTED DUE TO COLOR INTERFERENCE OF URINE PIGMENT   Protein, ur   (*)    Value: TEST NOT REPORTED DUE TO COLOR INTERFERENCE OF URINE PIGMENT   Leukocytes, UA   (*)    Value: TEST NOT REPORTED DUE TO COLOR INTERFERENCE OF URINE PIGMENT   All other components within normal limits  URINALYSIS, MICROSCOPIC (REFLEX) - Abnormal; Notable for the following:    Bacteria, UA MANY (*)    All other components within normal limits  URINALYSIS, ROUTINE W REFLEX MICROSCOPIC - Abnormal; Notable for the following:    Hgb urine dipstick MODERATE (*)    Ketones, ur 80 (*)    Squamous Epithelial / LPF 0-5 (*)    All other components within normal limits  WET PREP, GENITAL  PREGNANCY, URINE  HCG, QUANTITATIVE, PREGNANCY  ABO/RH  GC/CHLAMYDIA PROBE AMP ([place]) NOT AT [facility]   ____________________________________________  RADIOLOGY  [place] Pelvic Complete With Transvaginal  Result Date: 03/09/2017 CLINICAL DATA:  Vaginal bleeding. Negative serum beta HCG. History of surgery for endometriosis January 2018 EXAM: TRANSABDOMINAL AND TRANSVAGINAL ULTRASOUND OF PELVIS TECHNIQUE: Both transabdominal and transvaginal ultrasound examinations of the pelvis were performed. Transabdominal technique was performed for global imaging of the pelvis including uterus, ovaries, adnexal regions, and pelvic cul-de-sac. It was necessary to proceed with endovaginal exam following the transabdominal exam to visualize the  ovaries and uterus. COMPARISON:  None FINDINGS: Uterus Measurements: 8.0 x 4.0 x 4.6 cm. No fibroids or other mass visualized. Endometrium Thickness: 5.9 mm.  No focal abnormality visualized. Right ovary Measurements: 3.5 x 2.1 x 2.2 cm. Normal appearance/no adnexal mass. Left ovary Measurements: 3.3 x 1.4 x 2.1 cm. Normal appearance/no adnexal mass. Other findings Small amount of free fluid in the cul de sac with a few internal echoes. IMPRESSION: Normal uterus and ovaries. Small amount of complex fluid in the cul de sac. No mass lesion identified. Electronically Signed   By: Marlan Palauharles  Clark M.D.   On: 03/09/2017 17:11    ____________________________________________   PROCEDURES  Procedure(s) performed:  Procedures   ____________________________________________   INITIAL IMPRESSION / ASSESSMENT AND PLAN / ED COURSE  Pertinent labs & imaging results that were available during my care of the patient were reviewed by me and considered in my medical decision making (see chart for details).  Suspect heavy bleeding from clomid. Will check pregnancy/US to ensure no ectopic/miscarriage etc. If normal, will d/w her gyn.   Workup relatively unremarkable.  Patient's bleeding seems to have slowed a little bit.  I discussed with the on-call person for [facility] gynecology who suggested send her home on progesterone.  First dose of progesterone given here along with fluids.  A prescription given for the same and she will follow-up with her gynecologist next week.  ____________________________________________  FINAL CLINICAL IMPRESSION(S) / ED DIAGNOSES  Final diagnoses:  Vaginal bleeding     MEDICATIONS GIVEN DURING THIS VISIT:  Medications  conjugated estrogens (PREMARIN) injection 25 mg (25 mg Intravenous Given 03/09/17 1935)  sodium chloride 0.9 % bolus 1,000 mL (0 mLs Intravenous Stopped 03/09/17 1940)     NEW OUTPATIENT MEDICATIONS STARTED DURING THIS VISIT:  Discharge  Medication List as of 03/09/2017  7:44 PM    START taking these medications   Details  megestrol (MEGACE) 40 MG tablet Take 3 tablets (120 mg total) by mouth daily. Until you see your gynecologist, Starting Fri 03/09/2017, Print        Note:  This document was prepared using Dragon voice recognition software and may include unintentional dictation errors.   Marily Memos, MD 03/09/17 2351

## 2017-03-09 NOTE — ED Triage Notes (Signed)
Patient complaining of lower abdominal cramping and heavy vaginal bleeding since yesterday. States bleeding is "a little better today." States "I have been trying to get pregnant and I took clomin last month." Also complaining of nausea.

## 2017-03-09 NOTE — ED Notes (Signed)
Beeped to [phone].[place]

## 2017-03-12 LAB — GC/CHLAMYDIA PROBE AMP (~~LOC~~) NOT AT ARMC
Chlamydia: NEGATIVE
Neisseria Gonorrhea: NEGATIVE

## 2017-05-30 ENCOUNTER — Encounter: Payer: BLUE CROSS/BLUE SHIELD | Admitting: Family Medicine

## 2017-05-31 ENCOUNTER — Encounter: Payer: Self-pay | Admitting: Family Medicine

## 2017-12-31 ENCOUNTER — Encounter: Payer: Self-pay | Admitting: Family Medicine

## 2017-12-31 ENCOUNTER — Ambulatory Visit: Payer: Self-pay | Admitting: Family Medicine

## 2017-12-31 VITALS — BP 128/84 | HR 83 | Temp 97.5°F | Ht 62.0 in | Wt 184.5 lb

## 2017-12-31 DIAGNOSIS — T7840XD Allergy, unspecified, subsequent encounter: Secondary | ICD-10-CM

## 2017-12-31 DIAGNOSIS — R000 Tachycardia, unspecified: Secondary | ICD-10-CM

## 2017-12-31 MED ORDER — BETAMETHASONE SOD PHOS & ACET 6 (3-3) MG/ML IJ SUSP
6.0000 mg | Freq: Once | INTRAMUSCULAR | Status: AC
  Administered 2017-12-31: 6 mg via INTRAMUSCULAR

## 2017-12-31 MED ORDER — CETIRIZINE HCL 10 MG PO TABS: 10.0000 mg | ORAL_TABLET | Freq: Every day | ORAL | 0 refills | Status: DC

## 2017-12-31 NOTE — Progress Notes (Signed)
Chief Complaint  Patient presents with  . Sore Throat    pt here today c/o sore throat and rash    HPI  Patient presents today for recurrent hives. Breaking out in a rash several days ago. Went to E.D. 2 days ago after benadryl X3 made it worse. Mouth was swollen as well. Pt. showed picture of swollen lips, 2-3+ edema. Denies dyspnea, but feels very anxious. Has history of anxietyWas given shot of Epi in E.D. 2 days ago. Face is getting red again currently and is itching. She has multiple papules over both legs that were dxed as chiggers two weeks ago, not healing. Heart was fast at 126 last night. Concerned it may be her thyroid.  PMH: Smoking status noted ROS: Per HPI  Objective: BP 128/84   Pulse 83   Temp (!) 97.5 F (36.4 C) (Oral)   Ht 5\' 2"  (1.575 m)   Wt 184 lb 8 oz (83.7 kg)   BMI 33.75 kg/m  Gen: NAD, alert, cooperative with exam HEENT: NCAT, EOMI, PERRL CV: RRR, good S1/S2, no murmur Resp: CTABL, no wheezes, non-labored Ext: No edema, warm. Papular eruption on legs. Various stages of healing.  Neuro: Alert and oriented, No gross deficits  Assessment and plan:  1. Allergic reaction, subsequent encounter   2. Tachycardia     Meds ordered this encounter  Medications  . betamethasone acetate-betamethasone sodium phosphate (CELESTONE) injection 6 mg  . cetirizine (ZYRTEC) 10 MG tablet    Sig: Take 1 tablet (10 mg total) by mouth daily.    Dispense:  30 tablet    Refill:  0    Orders Placed This Encounter  Procedures  . TSH    Follow up as needed.  Mechele ClaudeWarren Luellen Howson, MD

## 2018-01-01 ENCOUNTER — Telehealth: Payer: Self-pay | Admitting: *Deleted

## 2018-01-01 LAB — TSH: TSH: 1.46 u[IU]/mL (ref 0.450–4.500)

## 2018-01-01 NOTE — Telephone Encounter (Signed)
Pt seen yesterday for rash Today has continued rash Pt is currently taking Medrol dose pack Did not pick up Cetirizine Pt will get RX and try this Will RTC if sxs persist or worsen

## 2018-01-24 ENCOUNTER — Encounter: Payer: Self-pay | Admitting: Family Medicine

## 2018-01-29 ENCOUNTER — Encounter: Payer: Self-pay | Admitting: Family Medicine

## 2018-02-25 ENCOUNTER — Ambulatory Visit (INDEPENDENT_AMBULATORY_CARE_PROVIDER_SITE_OTHER): Payer: Self-pay | Admitting: Allergy and Immunology

## 2018-02-25 ENCOUNTER — Encounter: Payer: Self-pay | Admitting: Allergy and Immunology

## 2018-02-25 VITALS — BP 130/80 | HR 94 | Temp 98.2°F | Resp 18 | Ht 62.0 in | Wt 182.0 lb

## 2018-02-25 DIAGNOSIS — T782XXA Anaphylactic shock, unspecified, initial encounter: Secondary | ICD-10-CM

## 2018-02-25 DIAGNOSIS — L500 Allergic urticaria: Secondary | ICD-10-CM

## 2018-02-25 MED ORDER — LORATADINE 10 MG PO TABS: 10.0000 mg | ORAL_TABLET | Freq: Every day | ORAL | 5 refills | Status: DC

## 2018-02-25 MED ORDER — FAMOTIDINE 20 MG PO TABS: 20.0000 mg | ORAL_TABLET | Freq: Every day | ORAL | 5 refills | Status: DC

## 2018-02-25 NOTE — Patient Instructions (Addendum)
  1.  Allergen avoidance measures?  2.  EpiPen, Auvi-Q 0.3, Benadryl, MD/ER evaluation for allergic reaction  3.  Use a combination of the following every day:   A.  Loratadine 10 mg -1 tablet 1 time per day  B.  Famotidine 20 mg -1 tablet 1 time per day  4.  Blood - CBC w/diff, TP, alpha-gal panel  5.  Further evaluation?  Yes if recurrent reactions.   6.  Contact clinic if recurrent reactions  7.  Obtain flu vaccine every fall

## 2018-02-25 NOTE — Progress Notes (Signed)
NEW PATIENT NOTE  Referring Provider: No ref. provider found Primary Provider: Mechele Claude, MD Date of office visit: 02/25/2018    Subjective:   Chief Complaint:  Julie Kim (DOB: [birth year]) is a 27 y.o. female who presents to the clinic on 02/25/2018 with a chief complaint of Allergic Reaction .     HPI: [name] presents to this clinic in evaluation of 2 allergic reactions.  Both allergic reactions were quite similar involving lip swelling and facial swelling and ear swelling and nausea and feeling achy along with diffuse urticaria requiring emergency room evaluation on both occasions and the delivery of injectable epinephrine and a systemic steroid.  Her 2 reactions occurred on 29 December 2017 and 09 February 2018.    Each reaction was preceded by the consumption of a cheeseburger approximately 4 to 6 hours prior to her reaction.  There was no other obvious provoking factor giving rise to this issue.  She has not really had a significant environmental change and she has not started any new supplements or over-the-counter medications.  She is not pregnant and is currently having a menstrual period.  She has been mammal free since her last reaction.  Past Medical History:  Diagnosis Date  . Anemia   . Angio-edema   . Anxiety   . Arthritis   . Endometriosis   . Headache    Migraines  . Hypertension    while taking an anxiety pill, no longer taking that medication  . Hypoglycemia   . Low iron   . Urticaria     Past Surgical History:  Procedure Laterality Date  . LAPAROSCOPIC OVARIAN CYSTECTOMY N/A 06/16/2016   Procedure: LAPAROSCOPIC OVARIAN CYSTECTOMY;  Surgeon: Essie Hart, MD;  Location: [facility];  Service: Gynecology;  Laterality: N/A;  . none      Allergies as of 02/25/2018      Reactions   Penicillins Hives   Has patient had a PCN reaction causing immediate rash, facial/tongue/throat swelling, SOB or lightheadedness with hypotension: Yes Has patient had a PCN  reaction causing severe rash involving mucus membranes or skin necrosis: No Has patient had a PCN reaction that required hospitalization No Has patient had a PCN reaction occurring within the last 10 years: No If all of the above answers are "NO", then may proceed with Cephalosporin use.      Medication List      diphenhydrAMINE 25 MG tablet Commonly known as:  BENADRYL Take 25 mg by mouth every 6 (six) hours as needed.   EPINEPHrine 0.3 mg/0.3 mL Soaj injection Commonly known as:  EPI-PEN INJECT CONTENTS OF 1 PEN AS NEEDED FOR ALLERGIC REACTION   MIDOL 200 MG Caps Generic drug:  Ibuprofen Take 1-2 capsules by mouth daily as needed (for pain).       Review of systems negative except as noted in HPI / PMHx or noted below:  Review of Systems  Constitutional: Negative.   HENT: Negative.   Eyes: Negative.   Respiratory: Negative.   Cardiovascular: Negative.   Gastrointestinal: Negative.   Genitourinary: Negative.   Musculoskeletal: Negative.   Skin: Negative.   Neurological: Negative.   Endo/Heme/Allergies: Negative.   Psychiatric/Behavioral: Negative.     Family History  Problem Relation Age of Onset  . Heart disease Brother 0       valve doesn't open and close properl  . Heart disease Paternal Uncle   . Allergy (severe) Mother        penicillin  . Allergy (  severe) Father        nylon, lanolin, fragrances, hot dogs.   . Eczema Father   . Psoriasis Father     Social History   Socioeconomic History  . Marital status: Married    Spouse name: Not on file  . Number of children: Not on file  . Years of education: Not on file  . Highest education level: Not on file  Occupational History  . Not on file  Social Needs  . Financial resource strain: Not on file  . Food insecurity:    Worry: Not on file    Inability: Not on file  . Transportation needs:    Medical: Not on file    Non-medical: Not on file  Tobacco Use  . Smoking status: Passive Smoke Exposure -  Never Smoker  . Smokeless tobacco: Never Used  Substance and Sexual Activity  . Alcohol use: No    Alcohol/week: 0.0 standard drinks  . Drug use: No  . Sexual activity: Not on file  Lifestyle  . Physical activity:    Days per week: Not on file    Minutes per session: Not on file  . Stress: Not on file  Relationships  . Social connections:    Talks on phone: Not on file    Gets together: Not on file    Attends religious service: Not on file    Active member of club or organization: Not on file    Attends meetings of clubs or organizations: Not on file    Relationship status: Not on file  . Intimate partner violence:    Fear of current or ex partner: Not on file    Emotionally abused: Not on file    Physically abused: Not on file    Forced sexual activity: Not on file  Other Topics Concern  . Not on file  Social History Narrative  . Not on file    Environmental and Social history  Lives in a house with a dry environment, a cat located inside the household, no carpet in the bedroom, no plastic on the bed, no plastic on the pillow, no smokers located inside the household.  Objective:   Vitals:   02/25/18 0940  BP: 130/80  Pulse: 94  Resp: 18  Temp: 98.2 F (36.8 C)   Height: 5\' 2"  (157.5 cm) Weight: 182 lb (82.6 kg)  Physical Exam  HENT:  Head: Normocephalic. Head is without right periorbital erythema and without left periorbital erythema.  Right Ear: Tympanic membrane, external ear and ear canal normal.  Left Ear: Tympanic membrane, external ear and ear canal normal.  Nose: Nose normal. No mucosal edema or rhinorrhea.  Mouth/Throat: Oropharynx is clear and moist and mucous membranes are normal. No oropharyngeal exudate.  Eyes: Pupils are equal, round, and reactive to light. Conjunctivae and lids are normal.  Neck: Trachea normal. No tracheal deviation present. No thyromegaly present.  Cardiovascular: Normal rate, regular rhythm, S1 normal, S2 normal and normal  heart sounds.  No murmur heard. Pulmonary/Chest: Effort normal. No stridor. No respiratory distress. She has no wheezes. She has no rales. She exhibits no tenderness.  Abdominal: Soft. She exhibits no distension and no mass. There is no hepatosplenomegaly. There is no tenderness. There is no rebound and no guarding.  Musculoskeletal: She exhibits no edema or tenderness.  Lymphadenopathy:       Head (right side): No tonsillar adenopathy present.       Head (left side): No tonsillar adenopathy present.  She has no cervical adenopathy.    She has no axillary adenopathy.  Neurological: She is alert.  Skin: No rash noted. She is not diaphoretic. No erythema. No pallor. Nails show no clubbing.    Diagnostics: Allergy skin tests were performed.  She did not demonstrate any hypersensitivity against a screening panel of foods.  Results of blood tests obtained 31 December 2017 identified TSH 1.46 0 IU/mL  Results of blood tests obtained 09 March 2017 identified WBC 6.4, absolute eosinophils 0, absolute lymphocyte 800, hemoglobin 11.5, MCV 75.5, platelet 353   Assessment and Plan:    1. Anaphylaxis, initial encounter   2. Allergic urticaria     1.  Allergen avoidance measures?  2.  EpiPen, Auvi-Q 0.3, Benadryl, MD/ER evaluation for allergic reaction  3.  Use a combination of the following every day:   A.  Loratadine 10 mg -1 tablet 1 time per day  B.  Famotidine 20 mg -1 tablet 1 time per day  4.  Blood - CBC w/diff, TP, alpha-gal panel  5.  Further evaluation?  Yes if recurrent reactions.   6.  Contact clinic if recurrent reactions  7.  Obtain flu vaccine every fall  Tamiya has had 2 episodes of anaphylaxis with unknown etiologic factor although temporally related to the consumption of a cheeseburger on both occasions.  She very well could have alpha gal syndrome and we will check a blood test for that disorder.  It is quite possible that she has persistent immunological  hyperactivity that has been modified by the use of systemic steroids administered on 2 occasions and we will look for thyroiditis contributing to this issue as well as checking a CBC as a general screen of her immune system.  I will contact her with the results of her blood test once they are available for review.  For now she will utilize an H1 and H2 receptor blocker on a consistent basis until we can get things sorted out in the near future.  Jessica Priest, MD Allergy / Immunology [facility]

## 2018-02-26 ENCOUNTER — Encounter: Payer: Self-pay | Admitting: Allergy and Immunology

## 2018-02-28 LAB — ALPHA-GAL PANEL
Alpha Gal IgE*: 25.5 kU/L — ABNORMAL HIGH (ref <0.10)
Beef (Bos spp) IgE: 7.09 kU/L — ABNORMAL HIGH (ref <0.35)
Class Interpretation: 2
Class Interpretation: 3
Class Interpretation: 3
Lamb/Mutton (Ovis spp) IgE: 1.17 kU/L — ABNORMAL HIGH (ref <0.35)
Pork (Sus spp) IgE: 4.04 kU/L — ABNORMAL HIGH (ref <0.35)

## 2018-02-28 LAB — CBC WITH DIFFERENTIAL/PLATELET
Basophils Absolute: 0 10*3/uL (ref 0.0–0.2)
Basos: 1 %
EOS (ABSOLUTE): 0.1 10*3/uL (ref 0.0–0.4)
Eos: 2 %
Hematocrit: 32.9 % — ABNORMAL LOW (ref 34.0–46.6)
Hemoglobin: 10.1 g/dL — ABNORMAL LOW (ref 11.1–15.9)
Immature Grans (Abs): 0 10*3/uL (ref 0.0–0.1)
Immature Granulocytes: 0 %
Lymphocytes Absolute: 1.4 10*3/uL (ref 0.7–3.1)
Lymphs: 22 %
MCH: 24 pg — ABNORMAL LOW (ref 26.6–33.0)
MCHC: 30.7 g/dL — ABNORMAL LOW (ref 31.5–35.7)
MCV: 78 fL — ABNORMAL LOW (ref 79–97)
Monocytes Absolute: 0.2 10*3/uL (ref 0.1–0.9)
Monocytes: 3 %
Neutrophils Absolute: 4.5 10*3/uL (ref 1.4–7.0)
Neutrophils: 72 %
Platelets: 368 10*3/uL (ref 150–450)
RBC: 4.2 x10E6/uL (ref 3.77–5.28)
RDW: 15.8 % — ABNORMAL HIGH (ref 12.3–15.4)
WBC: 6.2 10*3/uL (ref 3.4–10.8)

## 2018-02-28 LAB — THYROID PEROXIDASE ANTIBODY: Thyroperoxidase Ab SerPl-aCnc: 7 [IU]/mL (ref 0–34)

## 2018-03-01 ENCOUNTER — Telehealth: Payer: Self-pay | Admitting: Family Medicine

## 2018-03-01 ENCOUNTER — Encounter: Payer: Self-pay | Admitting: Allergy and Immunology

## 2018-03-01 NOTE — Telephone Encounter (Signed)
PT saw allergy doctor on Monday, and her hemoglobin was low (results in epic) and was told to call her primary for doctor to send in iron pills to pharmacy [facility]

## 2018-03-04 ENCOUNTER — Other Ambulatory Visit: Payer: Self-pay | Admitting: Family Medicine

## 2018-03-04 MED ORDER — FERROUS FUMARATE 324 (106 FE) MG PO TABS: 1.0000 | ORAL_TABLET | Freq: Two times a day (BID) | ORAL | 5 refills | Status: DC

## 2018-04-05 ENCOUNTER — Encounter: Payer: Self-pay | Admitting: Physician Assistant

## 2018-04-05 ENCOUNTER — Ambulatory Visit (INDEPENDENT_AMBULATORY_CARE_PROVIDER_SITE_OTHER): Payer: Self-pay | Admitting: Physician Assistant

## 2018-04-05 VITALS — BP 139/85 | HR 84 | Temp 97.7°F | Ht 62.0 in | Wt 189.0 lb

## 2018-04-05 DIAGNOSIS — Z91011 Allergy to milk products: Secondary | ICD-10-CM

## 2018-04-05 DIAGNOSIS — L02214 Cutaneous abscess of groin: Secondary | ICD-10-CM

## 2018-04-05 DIAGNOSIS — Z91018 Allergy to other foods: Secondary | ICD-10-CM | POA: Insufficient documentation

## 2018-04-05 MED ORDER — CLINDAMYCIN HCL 300 MG PO CAPS: 300.0000 mg | ORAL_CAPSULE | Freq: Three times a day (TID) | ORAL | 4 refills | Status: DC

## 2018-04-05 NOTE — Progress Notes (Signed)
BP 139/85   Pulse 84   Temp 97.7 F (36.5 C) (Oral)   Ht 5\' 2"  (1.575 m)   Wt 189 lb (85.7 kg)   BMI 34.57 kg/m    Subjective:    Patient ID: Julie Kim, female    DOB: 07-31-1990, 27 y.o.   MRN: [id number]  HPI: Julie Kim is a 27 y.o. female presenting on 04/05/2018 for Recurrent Skin Infections (right groin area )  Patient with known recurrent groin abscesses.  They will normally be much smaller.  This 1 has been present for about 3 days now.  Is a couple inches long.  There is no pus or drainage.  There is some blood at times.  She denies any fever or chills.  We have had a long discussion about hygiene and the use of antibacterial soap which she does use and to use a new washcloth every bath.  Past Medical History:  Diagnosis Date  . Anemia   . Angio-edema   . Anxiety   . Arthritis   . Endometriosis   . Headache    Migraines  . Hypertension    while taking an anxiety pill, no longer taking that medication  . Hypoglycemia   . Low iron   . Urticaria    Relevant past medical, surgical, family and social history reviewed and updated as indicated. Interim medical history since our last visit reviewed. Allergies and medications reviewed and updated. DATA REVIEWED: CHART IN EPIC  Family History reviewed for pertinent findings.  Review of Systems  Constitutional: Negative.   HENT: Negative.   Eyes: Negative.   Respiratory: Negative.   Gastrointestinal: Negative.   Genitourinary: Negative.   Skin: Positive for color change, rash and wound.    Allergies as of 04/05/2018      Reactions   Penicillins Hives   Has patient had a PCN reaction causing immediate rash, facial/tongue/throat swelling, SOB or lightheadedness with hypotension: Yes Has patient had a PCN reaction causing severe rash involving mucus membranes or skin necrosis: No Has patient had a PCN reaction that required hospitalization No Has patient had a PCN reaction occurring within the last 10 years:  No If all of the above answers are "NO", then may proceed with Cephalosporin use.   Alpha-gal       Medication List        Accurate as of 04/05/18  2:52 PM. Always use your most recent med list.          clindamycin 300 MG capsule Commonly known as:  CLEOCIN Take 1 capsule (300 mg total) by mouth 3 (three) times daily. Then one daily   diphenhydrAMINE 25 MG tablet Commonly known as:  BENADRYL Take 25 mg by mouth every 6 (six) hours as needed.   EPINEPHrine 0.3 mg/0.3 mL Soaj injection Commonly known as:  EPI-PEN INJECT CONTENTS OF 1 PEN AS NEEDED FOR ALLERGIC REACTION   famotidine 20 MG tablet Commonly known as:  PEPCID Take 1 tablet (20 mg total) by mouth daily.   Ferrous Fumarate 324 (106 Fe) MG Tabs tablet Commonly known as:  HEMOCYTE - 106 mg FE Take 1 tablet (106 mg of iron total) by mouth 2 (two) times daily.   loratadine 10 MG tablet Commonly known as:  CLARITIN Take 1 tablet (10 mg total) by mouth daily.   MIDOL 200 MG Caps Generic drug:  Ibuprofen Take 1-2 capsules by mouth daily as needed (for pain).          Objective:  BP 139/85   Pulse 84   Temp 97.7 F (36.5 C) (Oral)   Ht 5\' 2"  (1.575 m)   Wt 189 lb (85.7 kg)   BMI 34.57 kg/m   Allergies  Allergen Reactions  . Penicillins Hives    Has patient had a PCN reaction causing immediate rash, facial/tongue/throat swelling, SOB or lightheadedness with hypotension: Yes Has patient had a PCN reaction causing severe rash involving mucus membranes or skin necrosis: No Has patient had a PCN reaction that required hospitalization No Has patient had a PCN reaction occurring within the last 10 years: No If all of the above answers are "NO", then may proceed with Cephalosporin use.   . Alpha-Gal     Wt Readings from Last 3 Encounters:  04/05/18 189 lb (85.7 kg)  02/25/18 182 lb (82.6 kg)  12/31/17 184 lb 8 oz (83.7 kg)    Physical Exam  Constitutional: She is oriented to person, place, and time.  She appears well-developed and well-nourished.  HENT:  Head: Normocephalic and atraumatic.  Eyes: Pupils are equal, round, and reactive to light. Conjunctivae and EOM are normal.  Cardiovascular: Normal rate, regular rhythm, normal heart sounds and intact distal pulses.  Pulmonary/Chest: Effort normal and breath sounds normal.  Abdominal: Soft. Bowel sounds are normal.  Neurological: She is alert and oriented to person, place, and time. She has normal reflexes.  Skin: Skin is warm and dry. No rash noted.  Psychiatric: She has a normal mood and affect. Her behavior is normal. Judgment and thought content normal.        Assessment & Plan:   1. Abscess of groin, right - clindamycin (CLEOCIN) 300 MG capsule; Take 1 capsule (300 mg total) by mouth 3 (three) times daily. Then one daily  Dispense: 50 capsule; Refill: 4 Continue medication after the 10 days daily pill for prevention.  Encouraged to take in the next few months  Continue all other maintenance medications as listed above.  Follow up plan: No follow-ups on file.  Educational handout given for survey  Remus LofflerAngel S. Reality Dejonge PA-C [facility] [address]  [place], [place] [address] [phone]   04/05/2018, 2:52 PM

## 2018-10-31 IMAGING — US US PELVIS COMPLETE TRANSABD/TRANSVAG
1 series · 14 of 25 positions shown · non-contrast
Comparison: None

CLINICAL DATA: Vaginal bleeding. Negative serum beta HCG. History
of surgery for endometriosis May 2016

EXAM:
TRANSABDOMINAL AND TRANSVAGINAL ULTRASOUND OF PELVIS
TECHNIQUE: Both transabdominal and transvaginal ultrasound examinations of the
pelvis were performed. Transabdominal technique was performed for
global imaging of the pelvis including uterus, ovaries, adnexal
regions, and pelvic cul-de-sac. It was necessary to proceed with
endovaginal exam following the transabdominal exam to visualize the
ovaries and uterus.

[Series 1: us pelvis complete transabd/transvag · 0.22mm/px · 14 of 112 slices shown]
[im 1/112]
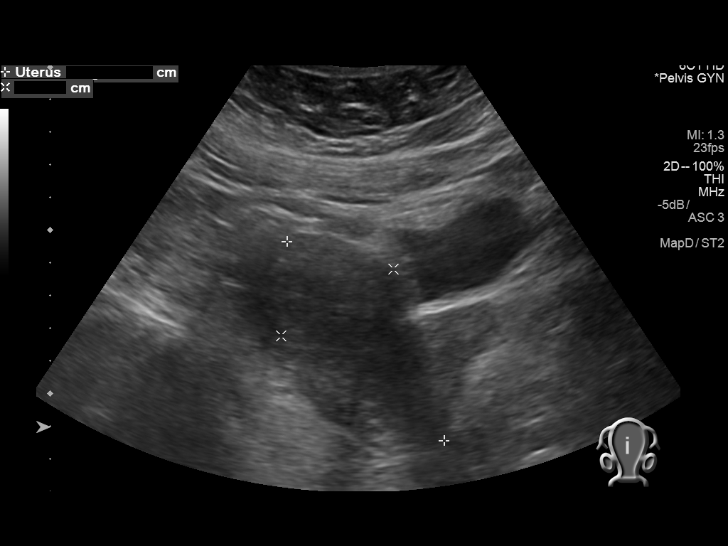
[im 10/112]
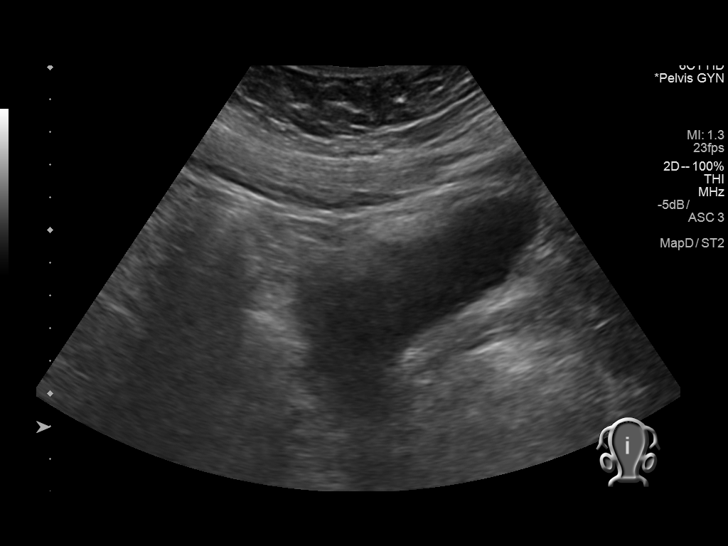
[im 19/112]
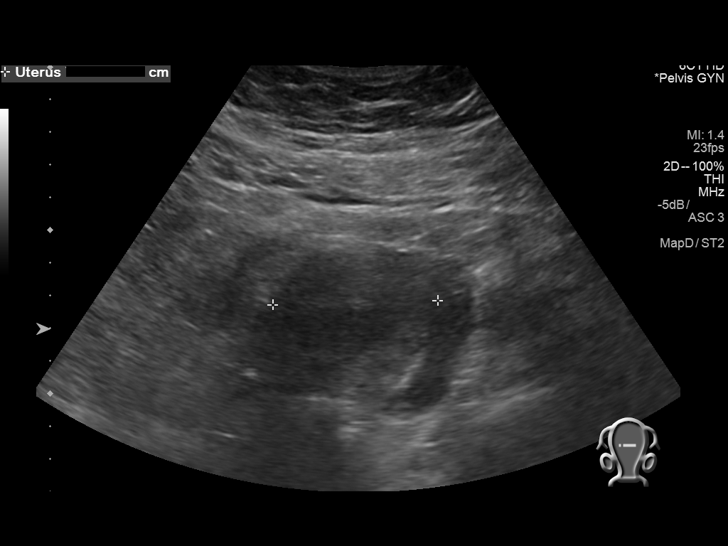
[im 28/112]
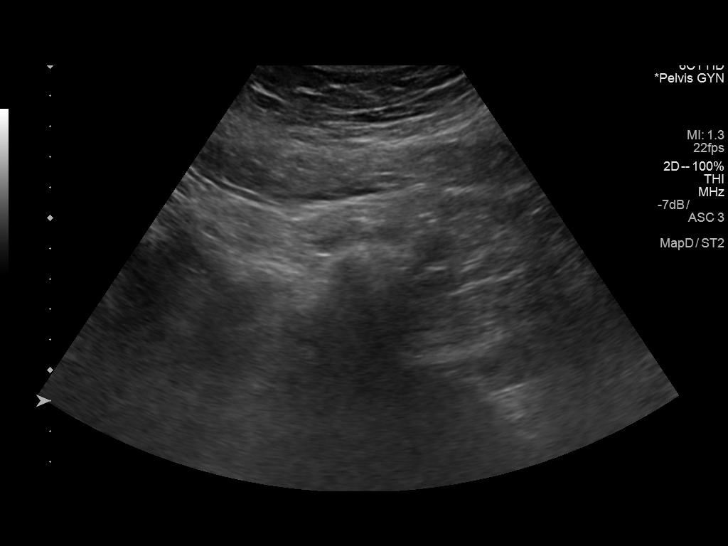
[im 38/112]
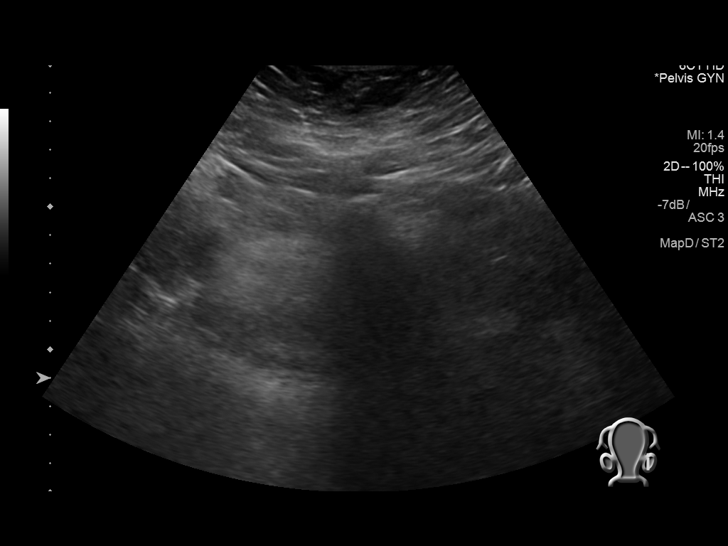
[im 42/112]
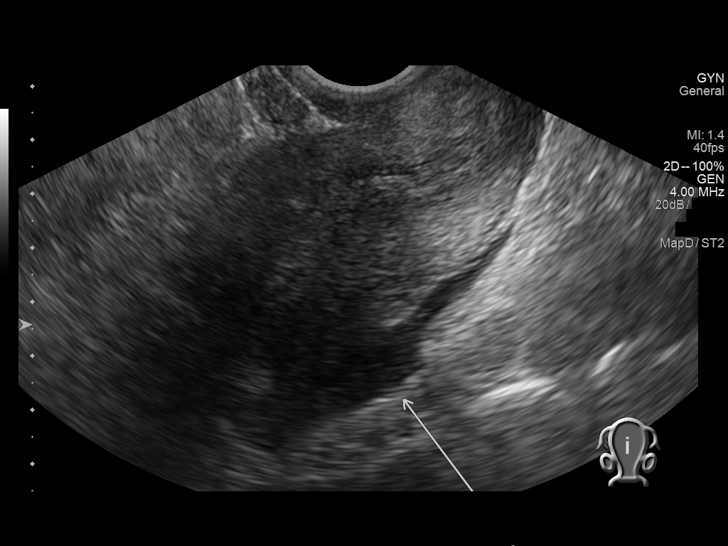
[im 51/112]
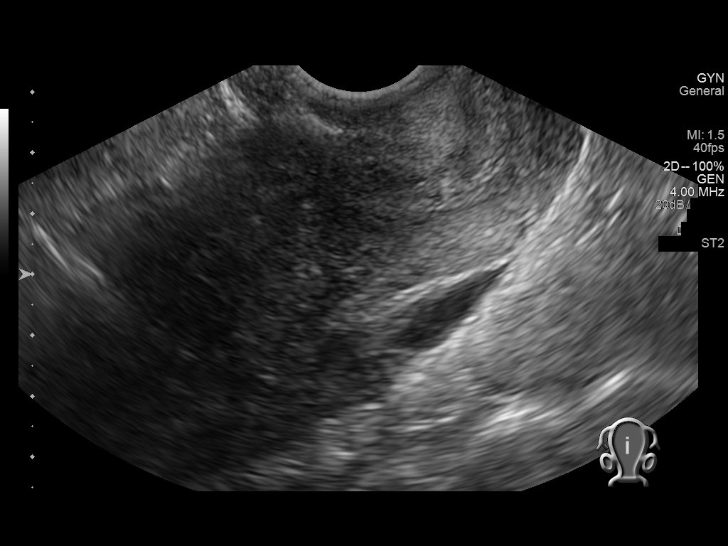
[im 61/112]
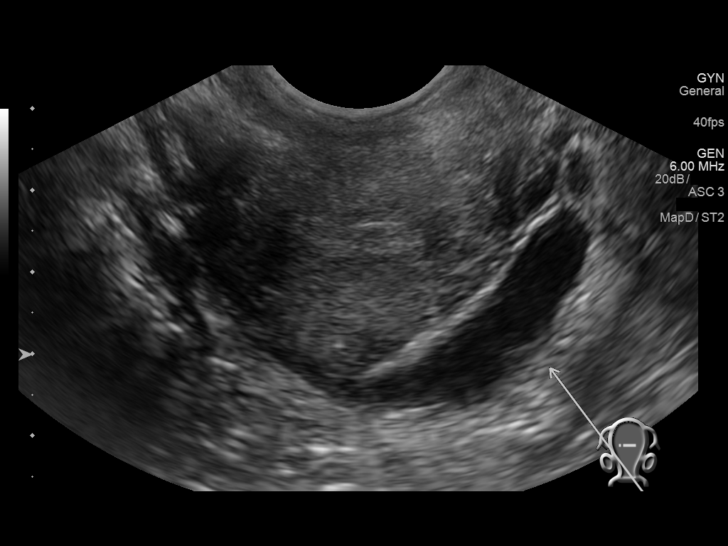
[im 70/112]
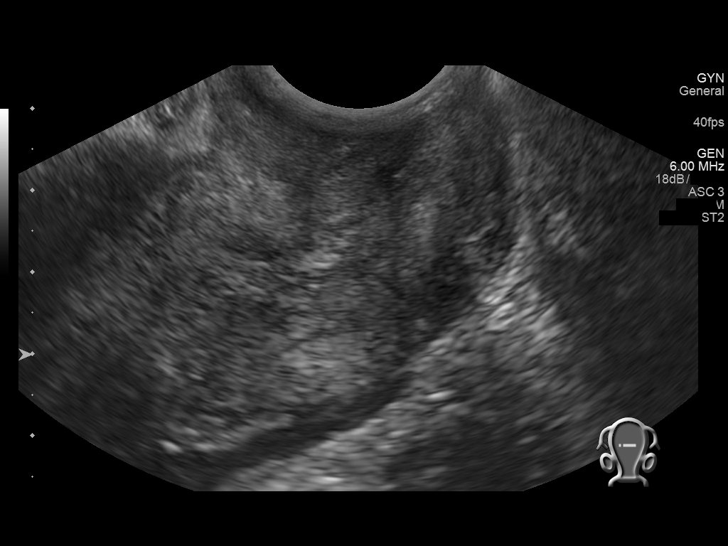
[im 75/112]
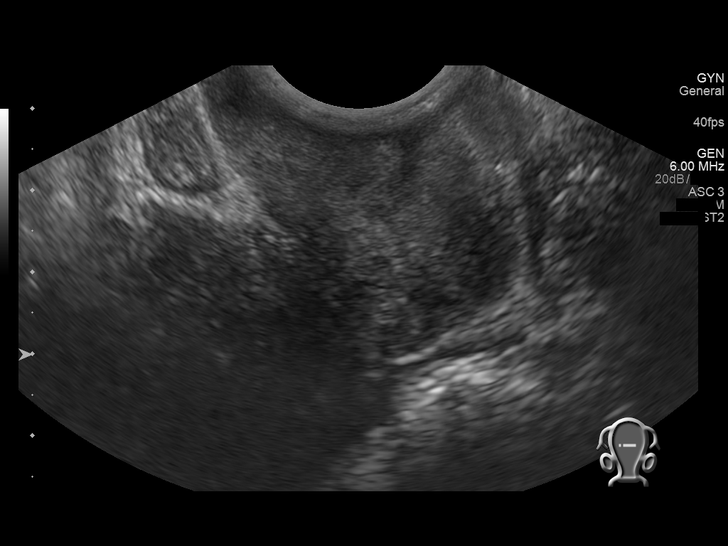
[im 84/112]
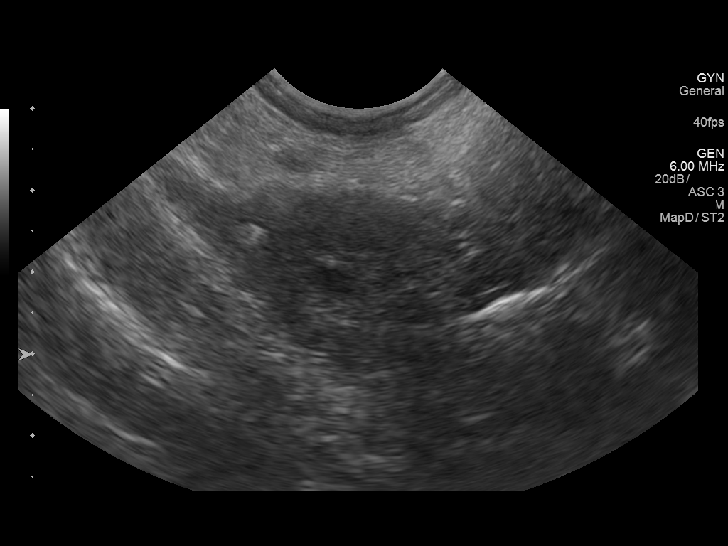
[im 93/112]
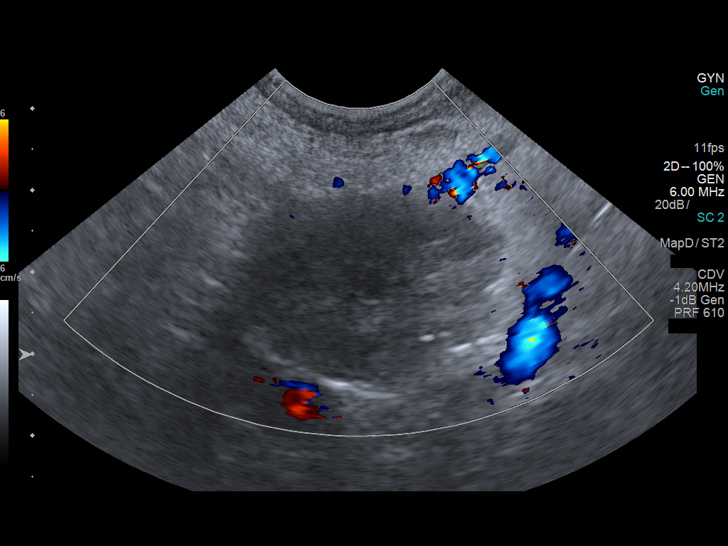
[im 102/112]
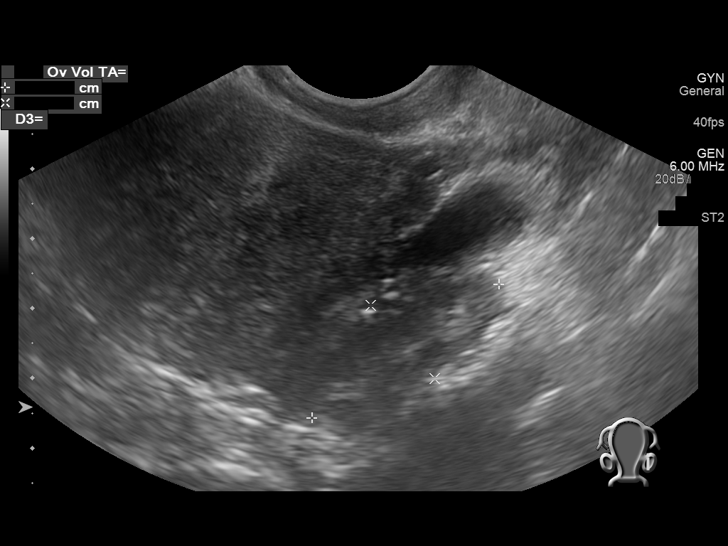
[im 112/112]
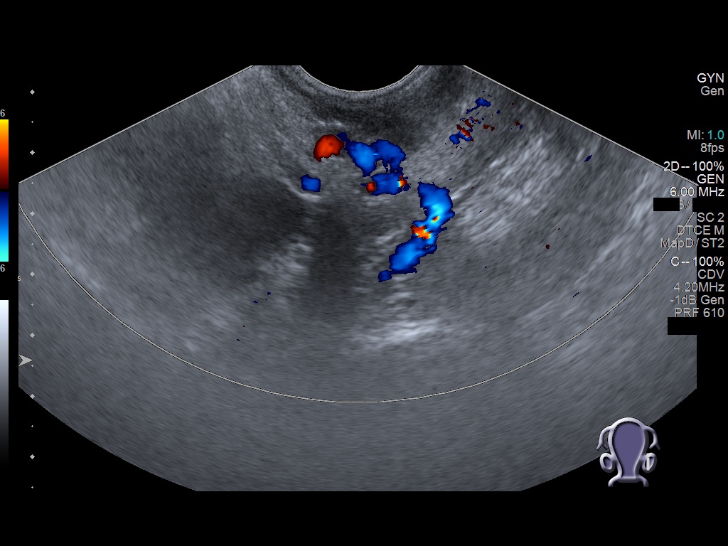

[14 of 25 positions shown; findings below may reference images not displayed]

FINDINGS: Uterus

Measurements: 8.0 x 4.0 x 4.6 cm.. No fibroids or other mass
visualized.

Endometrium

Thickness: 5.9 mm.  No focal abnormality visualized.

Right ovary

Measurements: 3.5 x 2.1 x 2.2 cm.. Normal appearance/no adnexal
mass.

Left ovary

Measurements: 3.3 x 1.4 x 2.1 cm. Normal appearance/no adnexal mass.

Other findings

Small amount of free fluid in the cul de sac with a few internal
echoes.
IMPRESSION: Normal uterus and ovaries. Small amount of complex fluid in the cul
de sac. No mass lesion identified.

## 2019-03-24 ENCOUNTER — Encounter: Payer: Self-pay | Admitting: Physician Assistant

## 2019-10-27 ENCOUNTER — Ambulatory Visit (INDEPENDENT_AMBULATORY_CARE_PROVIDER_SITE_OTHER): Payer: Self-pay | Admitting: Nurse Practitioner

## 2019-10-27 ENCOUNTER — Encounter: Payer: Self-pay | Admitting: Family Medicine

## 2019-10-27 ENCOUNTER — Other Ambulatory Visit: Payer: Self-pay

## 2019-10-27 VITALS — BP 126/82 | HR 83 | Temp 97.8°F | Resp 20 | Ht 62.0 in | Wt 187.4 lb

## 2019-10-27 DIAGNOSIS — Z7189 Other specified counseling: Secondary | ICD-10-CM

## 2019-10-27 DIAGNOSIS — Q159 Congenital malformation of eye, unspecified: Secondary | ICD-10-CM | POA: Insufficient documentation

## 2019-10-27 DIAGNOSIS — Z7689 Persons encountering health services in other specified circumstances: Secondary | ICD-10-CM

## 2019-10-27 NOTE — Assessment & Plan Note (Signed)
Patient is a 29 year old female who came to clinic today for neurology referral.  Patient has a history of intermittent right eye dilation of about 30 minutes in duration.  Comprehensive eye exam completed by ophthalmologist revealed no ocular abnormalities, patient has good ocular health.  Recommendation is for neuro work-up to rule out intracranial causes.  Provided education to patient to watch for emergency changes and to call clinic prior to referral appointment, or use emergency department after hours.  Referral completed.

## 2019-10-27 NOTE — Progress Notes (Signed)
Established Patient Office Visit  Subjective:  Patient ID: Julie Kim, female    DOB: Mar 23, 1991  Age: 29 y.o. MRN: [id number]  CC:  Chief Complaint  Patient presents with  . Neurology referral    HPI [facility] presents for neurology referral.  Patient has a history of intermittent right eye dilation of about 30 minutes in duration.  Comprehensive eye examination completed by ophthalmologist revealed no ocular abnormalities patient has good ocular health, recommendation is for neuro work-up to rule out intracranial causes.  Past Medical History:  Diagnosis Date  . Anemia   . Angio-edema   . Anxiety   . Arthritis   . Endometriosis   . Headache    Migraines  . Hypertension    while taking an anxiety pill, no longer taking that medication  . Hypoglycemia   . Low iron   . Urticaria     Past Surgical History:  Procedure Laterality Date  . LAPAROSCOPIC OVARIAN CYSTECTOMY N/A 06/16/2016   Procedure: LAPAROSCOPIC OVARIAN CYSTECTOMY;  Surgeon: Essie Hart, MD;  Location: [facility];  Service: Gynecology;  Laterality: N/A;  . none      Family History  Problem Relation Age of Onset  . Heart disease Brother 0       valve doesn't open and close properl  . Heart disease Paternal Uncle   . Allergy (severe) Mother        penicillin  . Allergy (severe) Father        nylon, lanolin, fragrances, hot dogs.   . Eczema Father   . Psoriasis Father     Social History   Socioeconomic History  . Marital status: Married    Spouse name: Not on file  . Number of children: Not on file  . Years of education: Not on file  . Highest education level: Not on file  Occupational History  . Not on file  Tobacco Use  . Smoking status: Passive Smoke Exposure - Never Smoker  . Smokeless tobacco: Never Used  Substance and Sexual Activity  . Alcohol use: No    Alcohol/week: 0.0 standard drinks  . Drug use: No  . Sexual activity: Not on file  Other Topics Concern  . Not on file  Social  History Narrative  . Not on file   Social Determinants of Health   Financial Resource Strain:   . Difficulty of Paying Living Expenses:   Food Insecurity:   . Worried About Programme researcher, broadcasting/film/video in the Last Year:   . Barista in the Last Year:   Transportation Needs:   . Freight forwarder (Medical):   Marland Kitchen Lack of Transportation (Non-Medical):   Physical Activity:   . Days of Exercise per Week:   . Minutes of Exercise per Session:   Stress:   . Feeling of Stress :   Social Connections:   . Frequency of Communication with Friends and Family:   . Frequency of Social Gatherings with Friends and Family:   . Attends Religious Services:   . Active Member of Clubs or Organizations:   . Attends Banker Meetings:   Marland Kitchen Marital Status:   Intimate Partner Violence:   . Fear of Current or Ex-Partner:   . Emotionally Abused:   Marland Kitchen Physically Abused:   . Sexually Abused:     Outpatient Medications Prior to Visit  Medication Sig Dispense Refill  . diphenhydrAMINE (BENADRYL) 25 MG tablet Take 25 mg by mouth every 6 (six) hours as  needed.    Marland Kitchen EPINEPHrine 0.3 mg/0.3 mL IJ SOAJ injection INJECT CONTENTS OF 1 PEN AS NEEDED FOR ALLERGIC REACTION  0  . clindamycin (CLEOCIN) 300 MG capsule Take 1 capsule (300 mg total) by mouth 3 (three) times daily. Then one daily 50 capsule 4  . famotidine (PEPCID) 20 MG tablet Take 1 tablet (20 mg total) by mouth daily. 30 tablet 5  . Ferrous Fumarate (HEMOCYTE) 324 (106 Fe) MG TABS tablet Take 1 tablet (106 mg of iron total) by mouth 2 (two) times daily. 60 tablet 5  . Ibuprofen (MIDOL) 200 MG CAPS Take 1-2 capsules by mouth daily as needed (for pain).    Marland Kitchen loratadine (CLARITIN) 10 MG tablet Take 1 tablet (10 mg total) by mouth daily. 30 tablet 5   No facility-administered medications prior to visit.    Allergies  Allergen Reactions  . Penicillins Hives    Has patient had a PCN reaction causing immediate rash, facial/tongue/throat  swelling, SOB or lightheadedness with hypotension: Yes Has patient had a PCN reaction causing severe rash involving mucus membranes or skin necrosis: No Has patient had a PCN reaction that required hospitalization No Has patient had a PCN reaction occurring within the last 10 years: No If all of the above answers are "NO", then may proceed with Cephalosporin use.   . Alpha-Gal     ROS Review of Systems  Constitutional: Negative.   HENT: Negative.   Eyes: Positive for pain. Negative for photophobia, discharge and redness.       Floaters present in visual field  Respiratory: Negative for chest tightness and shortness of breath.   Cardiovascular: Negative.   Gastrointestinal: Negative.   Genitourinary: Negative.   Musculoskeletal: Negative.   Skin: Negative for color change and rash.      Objective:    Physical Exam  Constitutional: She is oriented to person, place, and time. She appears well-developed and well-nourished.  HENT:  Head: Normocephalic.  Mouth/Throat: Oropharynx is clear and moist.  Eyes: Conjunctivae are normal.  Right eye dilation, floaters in visual field  Cardiovascular: Normal rate.  Pulmonary/Chest: Breath sounds normal.  Abdominal: Bowel sounds are normal.  Musculoskeletal:     Cervical back: Neck supple.  Neurological: She is alert and oriented to person, place, and time.  Skin: No rash noted. No erythema.  Psychiatric: She has a normal mood and affect. Her behavior is normal.    BP 126/82   Pulse 83   Temp 97.8 F (36.6 C) (Temporal)   Resp 20   Ht 5\' 2"  (1.575 m)   Wt 187 lb 6 oz (85 kg)   SpO2 96%   BMI 34.27 kg/m  Wt Readings from Last 3 Encounters:  10/27/19 187 lb 6 oz (85 kg)  04/05/18 189 lb (85.7 kg)  02/25/18 182 lb (82.6 kg)     Health Maintenance Due  Topic Date Due  . COVID-19 Vaccine (1) Never done  . PAP-Cervical Cytology Screening  Never done  . TETANUS/TDAP  06/21/2015  . PAP SMEAR-Modifier  07/12/2017    There  are no preventive care reminders to display for this patient.  Lab Results  Component Value Date   TSH 1.460 12/31/2017   Lab Results  Component Value Date   WBC 6.2 02/25/2018   HGB 10.1 (L) 02/25/2018   HCT 32.9 (L) 02/25/2018   MCV 78 (L) 02/25/2018   PLT 368 02/25/2018   Lab Results  Component Value Date   NA 138 03/09/2017  K 3.9 03/09/2017   CO2 21 (L) 03/09/2017   GLUCOSE 111 (H) 03/09/2017   BUN 11 03/09/2017   CREATININE 0.57 03/09/2017   BILITOT 0.3 10/10/2016   ALKPHOS 89 10/10/2016   AST 34 10/10/2016   ALT 29 10/10/2016   PROT 7.6 10/10/2016   ALBUMIN 4.1 10/10/2016   CALCIUM 9.1 03/09/2017   ANIONGAP 11 03/09/2017   Lab Results  Component Value Date   CHOL 161 10/10/2016   Lab Results  Component Value Date   HDL 31 (L) 10/10/2016   Lab Results  Component Value Date   LDLCALC 80 10/10/2016   Lab Results  Component Value Date   TRIG 250 (H) 10/10/2016   Lab Results  Component Value Date   CHOLHDL 5.2 (H) 10/10/2016   No results found for: HGBA1C    Assessment & Plan:   Problem List Items Addressed This Visit      Other   Referral of patient without examination or treatment - Primary    Patient is a 29 year old female who came to clinic today for neurology referral.  Patient has a history of intermittent right eye dilation of about 30 minutes in duration.  Comprehensive eye exam completed by ophthalmologist revealed no ocular abnormalities, patient has good ocular health.  Recommendation is for neuro work-up to rule out intracranial causes.  Provided education to patient to watch for emergency changes and to call clinic prior to referral appointment, or use emergency department after hours.  Referral completed.         No orders of the defined types were placed in this encounter.   Follow-up: Return if symptoms worsen or fail to improve.    Ivy Lynn, NP

## 2020-02-24 ENCOUNTER — Other Ambulatory Visit: Payer: Self-pay

## 2020-02-24 ENCOUNTER — Ambulatory Visit (INDEPENDENT_AMBULATORY_CARE_PROVIDER_SITE_OTHER): Payer: Self-pay | Admitting: Family Medicine

## 2020-02-24 ENCOUNTER — Encounter: Payer: Self-pay | Admitting: Family Medicine

## 2020-02-24 VITALS — BP 123/89 | HR 95 | Temp 98.2°F | Ht 62.0 in | Wt 196.4 lb

## 2020-02-24 DIAGNOSIS — H938X2 Other specified disorders of left ear: Secondary | ICD-10-CM

## 2020-02-24 DIAGNOSIS — Z0130 Encounter for examination of blood pressure without abnormal findings: Secondary | ICD-10-CM

## 2020-02-24 DIAGNOSIS — F411 Generalized anxiety disorder: Secondary | ICD-10-CM

## 2020-02-24 DIAGNOSIS — Q159 Congenital malformation of eye, unspecified: Secondary | ICD-10-CM

## 2020-02-24 DIAGNOSIS — R208 Other disturbances of skin sensation: Secondary | ICD-10-CM

## 2020-02-24 MED ORDER — FLUTICASONE PROPIONATE 50 MCG/ACT NA SUSP: 2.0000 | Freq: Every day | NASAL | 6 refills | Status: DC

## 2020-02-24 MED ORDER — CITALOPRAM HYDROBROMIDE 20 MG PO TABS: 20.0000 mg | ORAL_TABLET | Freq: Every day | ORAL | 5 refills | Status: DC

## 2020-02-24 MED ORDER — BUSPIRONE HCL 5 MG PO TABS: 5.0000 mg | ORAL_TABLET | Freq: Three times a day (TID) | ORAL | 5 refills | Status: DC

## 2020-02-24 NOTE — Patient Instructions (Signed)
Generalized Anxiety Disorder, Adult Generalized anxiety disorder (GAD) is a mental health disorder. People with this condition constantly worry about everyday events. Unlike normal anxiety, worry related to GAD is not triggered by a specific event. These worries also do not fade or get better with time. GAD interferes with life functions, including relationships, work, and school. GAD can vary from mild to severe. People with severe GAD can have intense waves of anxiety with physical symptoms (panic attacks). What are the causes? The exact cause of GAD is not known. What increases the risk? This condition is more likely to develop in:  Women.  People who have a family history of anxiety disorders.  People who are very shy.  People who experience very stressful life events, such as the death of a loved one.  People who have a very stressful family environment. What are the signs or symptoms? People with GAD often worry excessively about many things in their lives, such as their health and family. They may also be overly concerned about:  Doing well at work.  Being on time.  Natural disasters.  Friendships. Physical symptoms of GAD include:  Fatigue.  Muscle tension or having muscle twitches.  Trembling or feeling shaky.  Being easily startled.  Feeling like your heart is pounding or racing.  Feeling out of breath or like you cannot take a deep breath.  Having trouble falling asleep or staying asleep.  Sweating.  Nausea, diarrhea, or irritable bowel syndrome (IBS).  Headaches.  Trouble concentrating or remembering facts.  Restlessness.  Irritability. How is this diagnosed? Your health care provider can diagnose GAD based on your symptoms and medical history. You will also have a physical exam. The health care provider will ask specific questions about your symptoms, including how severe they are, when they started, and if they come and go. Your health care  provider may ask you about your use of alcohol or drugs, including prescription medicines. Your health care provider may refer you to a mental health specialist for further evaluation. Your health care provider will do a thorough examination and may perform additional tests to rule out other possible causes of your symptoms. To be diagnosed with GAD, a person must have anxiety that:  Is out of his or her control.  Affects several different aspects of his or her life, such as work and relationships.  Causes distress that makes him or her unable to take part in normal activities.  Includes at least three physical symptoms of GAD, such as restlessness, fatigue, trouble concentrating, irritability, muscle tension, or sleep problems. Before your health care provider can confirm a diagnosis of GAD, these symptoms must be present more days than they are not, and they must last for six months or longer. How is this treated? The following therapies are usually used to treat GAD:  Medicine. Antidepressant medicine is usually prescribed for long-term daily control. Antianxiety medicines may be added in severe cases, especially when panic attacks occur.  Talk therapy (psychotherapy). Certain types of talk therapy can be helpful in treating GAD by providing support, education, and guidance. Options include: ? Cognitive behavioral therapy (CBT). People learn coping skills and techniques to ease their anxiety. They learn to identify unrealistic or negative thoughts and behaviors and to replace them with positive ones. ? Acceptance and commitment therapy (ACT). This treatment teaches people how to be mindful as a way to cope with unwanted thoughts and feelings. ? Biofeedback. This process trains you to manage your body's response (  physiological response) through breathing techniques and relaxation methods. You will work with a therapist while machines are used to monitor your physical symptoms.  Stress  management techniques. These include yoga, meditation, and exercise. A mental health specialist can help determine which treatment is best for you. Some people see improvement with one type of therapy. However, other people require a combination of therapies. Follow these instructions at home:  Take over-the-counter and prescription medicines only as told by your health care provider.  Try to maintain a normal routine.  Try to anticipate stressful situations and allow extra time to manage them.  Practice any stress management or self-calming techniques as taught by your health care provider.  Do not punish yourself for setbacks or for not making progress.  Try to recognize your accomplishments, even if they are small.  Keep all follow-up visits as told by your health care provider. This is important. Contact a health care provider if:  Your symptoms do not get better.  Your symptoms get worse.  You have signs of depression, such as: ? A persistently sad, cranky, or irritable mood. ? Loss of enjoyment in activities that used to bring you joy. ? Change in weight or eating. ? Changes in sleeping habits. ? Avoiding friends or family members. ? Loss of energy for normal tasks. ? Feelings of guilt or worthlessness. Get help right away if:  You have serious thoughts about hurting yourself or others. If you ever feel like you may hurt yourself or others, or have thoughts about taking your own life, get help right away. You can go to your nearest emergency department or call:  Your local emergency services (911 in the [place]  A suicide crisis helpline, such as the [facility] at 574-069-0725. This is open 24 hours a day. Summary  Generalized anxiety disorder (GAD) is a mental health disorder that involves worry that is not triggered by a specific event.  People with GAD often worry excessively about many things in their lives, such as their health and  family.  GAD may cause physical symptoms such as restlessness, trouble concentrating, sleep problems, frequent sweating, nausea, diarrhea, headaches, and trembling or muscle twitching.  A mental health specialist can help determine which treatment is best for you. Some people see improvement with one type of therapy. However, other people require a combination of therapies. This information is not intended to replace advice given to you by your health care provider. Make sure you discuss any questions you have with your health care provider. Document Revised: 04/20/2017 Document Reviewed: 03/28/2016 Elsevier Patient Education  2020 [place], Adult Stress is a normal reaction to life events. Stress is what you feel when life demands more than you are used to, or more than you think you can handle. Some stress can be useful, such as studying for a test or meeting a deadline at work. Stress that occurs too often or for too long can cause problems. It can affect your emotional health and interfere with relationships and normal daily activities. Too much stress can weaken your body's defense system (immune system) and increase your risk for physical illness. If you already have a medical problem, stress can make it worse. What are the causes? All sorts of life events can cause stress. An event that causes stress for one person may not be stressful for another person. Major life events, whether positive or negative, commonly cause stress. Examples include:  Losing a job or starting a new  job.  Losing a loved one.  Moving to a new town or home.  Getting married or divorced.  Having a baby.  Getting injured or sick. Less obvious life events can also cause stress, especially if they occur day after day or in combination with each other. Examples include:  Working long hours.  Driving in traffic.  Caring for children.  Being in debt.  Being in a difficult relationship. What are  the signs or symptoms? Stress can cause emotional symptoms, including:  Anxiety. This is feeling worried, afraid, on edge, overwhelmed, or out of control.  Anger, including irritation or impatience.  Depression. This is feeling sad, down, helpless, or guilty.  Trouble focusing, remembering, or making decisions. Stress can cause physical symptoms, including:  Aches and pains. These may affect your head, neck, back, stomach, or other areas of your body.  Tight muscles or a clenched jaw.  Low energy.  Trouble sleeping. Stress can cause unhealthy behaviors, including:  Eating to feel better (overeating) or skipping meals.  Working too much or putting off tasks.  Smoking, drinking alcohol, or using drugs to feel better. How is this diagnosed? Stress is diagnosed through an assessment by your health care provider. He or she may diagnose this condition based on:  Your symptoms and any stressful life events.  Your medical history.  Tests to rule out other causes of your symptoms. Depending on your condition, your health care provider may refer you to a specialist for further evaluation. How is this treated?  Stress management techniques are the recommended treatment for stress. Medicine is not typically recommended for the treatment of stress. Techniques to reduce your reaction to stressful life events include:  Stress identification. Monitor yourself for symptoms of stress and identify what causes stress for you. These skills may help you to avoid or prepare for stressful events.  Time management. Set your priorities, keep a calendar of events, and learn to say no. Taking these actions can help you avoid making too many commitments. Techniques for coping with stress include:  Rethinking the problem. Try to think realistically about stressful events rather than ignoring them or overreacting. Try to find the positives in a stressful situation rather than focusing on the  negatives.  Exercise. Physical exercise can release both physical and emotional tension. The key is to find a form of exercise that you enjoy and do it regularly.  Relaxation techniques. These relax the body and mind. The key is to find one or more that you enjoy and use the techniques regularly. Examples include: ? Meditation, deep breathing, or progressive relaxation techniques. ? Yoga or tai chi. ? Biofeedback, mindfulness techniques, or journaling. ? Listening to music, being out in nature, or participating in other hobbies.  Practicing a healthy lifestyle. Eat a balanced diet, drink plenty of water, limit or avoid caffeine, and get plenty of sleep.  Having a strong support network. Spend time with family, friends, or other people you enjoy being around. Express your feelings and talk things over with someone you trust. Counseling or talk therapy with a mental health professional may be helpful if you are having trouble managing stress on your own. Follow these instructions at home: Lifestyle   Avoid drugs.  Do not use any products that contain nicotine or tobacco, such as cigarettes, e-cigarettes, and chewing tobacco. If you need help quitting, ask your health care provider.  Limit alcohol intake to no more than 1 drink a day for nonpregnant women and 2 drinks a  day for men. One drink equals 12 oz of beer, 5 oz of wine, or 1 oz of hard liquor  Do not use alcohol or drugs to relax.  Eat a balanced diet that includes fresh fruits and vegetables, whole grains, lean meats, fish, eggs, and beans, and low-fat dairy. Avoid processed foods and foods high in added fat, sugar, and salt.  Exercise at least 30 minutes on 5 or more days each week.  Get 7-8 hours of sleep each night. General instructions   Practice stress management techniques as discussed with your health care provider.  Drink enough fluid to keep your urine clear or pale yellow.  Take over-the-counter and prescription  medicines only as told by your health care provider.  Keep all follow-up visits as told by your health care provider. This is important. Contact a health care provider if:  Your symptoms get worse.  You have new symptoms.  You feel overwhelmed by your problems and can no longer manage them on your own. Get help right away if:  You have thoughts of hurting yourself or others. If you ever feel like you may hurt yourself or others, or have thoughts about taking your own life, get help right away. You can go to your nearest emergency department or call:  Your local emergency services (911 in the [place]  A suicide crisis helpline, such as the [facility] at 6408536951. This is open 24 hours a day. Summary  Stress is a normal reaction to life events. It can cause problems if it happens too often or for too long.  Practicing stress management techniques is the best way to treat stress.  Counseling or talk therapy with a mental health professional may be helpful if you are having trouble managing stress on your own. This information is not intended to replace advice given to you by your health care provider. Make sure you discuss any questions you have with your health care provider. Document Revised: 12/06/2018 Document Reviewed: 06/28/2016 Elsevier Patient Education  [place].

## 2020-02-24 NOTE — Progress Notes (Signed)
Subjective: CC: Elevated BP PCP: Mechele Claude, MD  VAN:VBTY Julie Kim is a 29 y.o. female presenting to clinic today for:  1. Elevated BP Julie Kim has been checking her BP at home BID or the last 2 weeks. She reports it has been 130s/90s. She does not have a dx of HTN and does not have a hx of elevated BP. She is taking care of her elderly grandmother and reports that this is stressful. She also reports that she has a lot of anxiety believes this is why her BP has been higher lately. She denies chest pain, shortness of breath, edema, fatigue, or blurry vision.   2. Anxiety She reports generalized anxiety with restlessness, irritability and excessive worrying. She reports panic attacks at times. She has tried lexapro and cymbalta in the past. One of them raised her BP but she isn't sure which one it was. She would like to try medication again.   3. Facial burning Julie Kim reports that the right side of her face over her right cheek will become bright red and "burn from the inside out". This will last for about 30 minutes and occurs with headaches. She take Excedrin migraine with good relief for her headaches and has a hx of migraines. She denies numbness or difficulty with speech. Denies eye redness or changes in vision.    4. Eye dilating Julie Kim reports her right eye dilates for about 30-40. This happens about once a week. This started about 4 months ago. It has improved some as it occurs less frequently now. This also only happens with headaches. She also reports sensitivity to light, burning, and tears of her right eye when this happens. Goes to eye doctor yearly and had an visit when this first starting happening with a normal exam. The ophthalmologist recommended a neurology consults. A referral was previously placed by this office but she did not receive a call regarding an appointment, so she has not been.   Relevant past medical, surgical, family, and social history reviewed and updated as  indicated.  Allergies and medications reviewed and updated.  Allergies  Allergen Reactions  . Penicillins Hives    Has patient had a PCN reaction causing immediate rash, facial/tongue/throat swelling, SOB or lightheadedness with hypotension: Yes Has patient had a PCN reaction causing severe rash involving mucus membranes or skin necrosis: No Has patient had a PCN reaction that required hospitalization No Has patient had a PCN reaction occurring within the last 10 years: No If all of the above answers are "NO", then may proceed with Cephalosporin use.   Julie Kim    Past Medical History:  Diagnosis Date  . Anemia   . Angio-edema   . Anxiety   . Arthritis   . Endometriosis   . Headache    Migraines  . Hypertension    while taking an anxiety pill, no longer taking that medication  . Hypoglycemia   . Low iron   . Urticaria     Current Outpatient Medications:  .  diphenhydrAMINE (BENADRYL) 25 MG tablet, Take 25 mg by mouth every 6 (six) hours as needed., Disp: , Rfl:  .  EPINEPHrine 0.3 mg/0.3 mL IJ SOAJ injection, INJECT CONTENTS OF 1 PEN AS NEEDED FOR ALLERGIC REACTION, Disp: , Rfl: 0 Social History   Socioeconomic History  . Marital status: Married    Spouse name: Not on file  . Number of children: Not on file  . Years of education: Not on file  . Highest education level:  Not on file  Occupational History  . Not on file  Tobacco Use  . Smoking status: Passive Smoke Exposure - Never Smoker  . Smokeless tobacco: Never Used  Vaping Use  . Vaping Use: Never used  Substance and Sexual Activity  . Alcohol use: No    Alcohol/week: 0.0 standard drinks  . Drug use: No  . Sexual activity: Not on file  Other Topics Concern  . Not on file  Social History Narrative  . Not on file   Social Determinants of Health   Financial Resource Strain:   . Difficulty of Paying Living Expenses: Not on file  Food Insecurity:   . Worried About Programme researcher, broadcasting/film/video in the Last Year:  Not on file  . Ran Out of Food in the Last Year: Not on file  Transportation Needs:   . Lack of Transportation (Medical): Not on file  . Lack of Transportation (Non-Medical): Not on file  Physical Activity:   . Days of Exercise per Week: Not on file  . Minutes of Exercise per Session: Not on file  Stress:   . Feeling of Stress : Not on file  Social Connections:   . Frequency of Communication with Friends and Family: Not on file  . Frequency of Social Gatherings with Friends and Family: Not on file  . Attends Religious Services: Not on file  . Active Member of Clubs or Organizations: Not on file  . Attends Banker Meetings: Not on file  . Marital Status: Not on file  Intimate Partner Violence:   . Fear of Current or Ex-Partner: Not on file  . Emotionally Abused: Not on file  . Physically Abused: Not on file  . Sexually Abused: Not on file   Family History  Problem Relation Age of Onset  . Heart disease Brother 0       valve doesn't open and close properl  . Heart disease Paternal Uncle   . Allergy (severe) Mother        penicillin  . Allergy (severe) Father        nylon, lanolin, fragrances, hot dogs.   . Eczema Father   . Psoriasis Father     Review of Systems  Per HPI.     Office Visit from 02/24/2020 in [facility]  PHQ-2 Total Score 0     GAD 7 : Generalized Anxiety Score 02/24/2020  Nervous, Anxious, on Edge 2  Control/stop worrying 3  Worry too much - different things 3  Trouble relaxing 3  Restless 3  Easily annoyed or irritable 3  Afraid - awful might happen 3  Total GAD 7 Score 20    Objective: Office vital signs reviewed. BP 123/89   Pulse 95   Temp 98.2 F (36.8 C) (Temporal)   Ht 5\' 2"  (1.575 m)   Wt 196 lb 6 oz (89.1 kg)   BMI 35.92 kg/m   Physical Examination:  Physical Exam Vitals and nursing note reviewed.  Constitutional:      Appearance: Normal appearance.  HENT:     Head: Normocephalic and  atraumatic.     Right Ear: Ear canal and external ear normal. No drainage, swelling or tenderness. A middle ear effusion is present. There is no impacted cerumen. No mastoid tenderness. Tympanic membrane is not injected, perforated, erythematous, retracted or bulging.     Left Ear: Ear canal and external ear normal. No drainage, swelling or tenderness. A middle ear effusion is present. There is  no impacted cerumen. No mastoid tenderness. Tympanic membrane is not injected, perforated, erythematous, retracted or bulging.  Eyes:     General: Lids are normal.     Extraocular Movements: Extraocular movements intact.     Right eye: Normal extraocular motion and no nystagmus.     Left eye: Normal extraocular motion and no nystagmus.     Conjunctiva/sclera: Conjunctivae normal.     Pupils: Pupils are equal, round, and reactive to light.  Cardiovascular:     Rate and Rhythm: Normal rate and regular rhythm.     Heart sounds: Normal heart sounds. No murmur heard.   Pulmonary:     Effort: Pulmonary effort is normal.     Breath sounds: Normal breath sounds.  Musculoskeletal:     Cervical back: Normal range of motion and neck supple. No tenderness.     Right lower leg: No edema.     Left lower leg: No edema.  Skin:    General: Skin is warm and dry.  Neurological:     General: No focal deficit present.     Mental Status: She is alert and oriented to person, place, and time.     Motor: No weakness.     Gait: Gait normal.  Psychiatric:        Mood and Affect: Mood normal.        Behavior: Behavior normal.      Results for orders placed or performed in visit on 02/25/18  CBC with Differential/Platelet  Result Value Ref Range   WBC 6.2 3.4 - 10.8 x10E3/uL   RBC 4.20 3.77 - 5.28 x10E6/uL   Hemoglobin 10.1 (L) 11.1 - 15.9 g/dL   Hematocrit 16.1 (L) 09.6 - 46.6 %   MCV 78 (L) 79 - 97 fL   MCH 24.0 (L) 26.6 - 33.0 pg   MCHC 30.7 (L) 31 - 35 g/dL   RDW 04.5 (H) 40.9 - 81.1 %   Platelets 368 150  - 450 x10E3/uL   Neutrophils 72 Not Estab. %   Lymphs 22 Not Estab. %   Monocytes 3 Not Estab. %   Eos 2 Not Estab. %   Basos 1 Not Estab. %   Neutrophils Absolute 4.5 1 - 7 x10E3/uL   Lymphocytes Absolute 1.4 0 - 3 x10E3/uL   Monocytes Absolute 0.2 0 - 0 x10E3/uL   EOS (ABSOLUTE) 0.1 0.0 - 0.4 x10E3/uL   Basophils Absolute 0.0 0 - 0 x10E3/uL   Immature Granulocytes 0 Not Estab. %   Immature Grans (Abs) 0.0 0.0 - 0.1 x10E3/uL  Thyroid peroxidase antibody  Result Value Ref Range   Thyroperoxidase Ab SerPl-aCnc 7 0 - 34 IU/mL  Alpha-Gal Panel  Result Value Ref Range   Beef (Bos spp) IgE 7.09 (H) <0.35 kU/L   Class Interpretation 3    Lamb/Mutton (Ovis spp) IgE 1.17 (H) <0.35 kU/L   Class Interpretation 2    Pork (Sus spp) IgE 4.04 (H) <0.35 kU/L   Class Interpretation 3    Alpha Gal IgE* 25.50 (H) <0.10 kU/L     Assessment/ Plan: Julie Kim was seen today for hypertension.  Diagnoses and all orders for this visit:  GAD (generalized anxiety disorder) GAD7 score was 20 today. Start Celexa. Buspar as needed. Follow up in 1 months. Report new or worsening symptoms.  -     citalopram (CELEXA) 20 MG tablet; Take 1 tablet (20 mg total) by mouth daily. -     busPIRone (BUSPAR) 5 MG tablet; Take 1 tablet (  5 mg total) by mouth 3 (three) times daily.  Blood pressure check BP 123/89 today in office. BP log given. Instructions to bring the log to her next visit in 1 month. Dash diet, low salt, exercise, stress reduction. RTO for new or worsening symptoms, or consistent BP readings >140/90s.   Eye abnormality Burning sensation No abnormalities today on exam. New referral to neurology placed with right eye dilation and right face burning sensation for further assessment.  -     Ambulatory referral to Neurology  Ear congestion, left No indications for infection today. Start flonase daily for middle ear effusion. RTO for new or worsening symptoms, or if symptoms persist.  -     fluticasone  (FLONASE) 50 MCG/ACT nasal spray; Place 2 sprays into both nostrils daily.  Follow up in 1 month to re-assess anxiety.   The above assessment and management plan was discussed with the patient. The patient verbalized understanding of and has agreed to the management plan. Patient is aware to call the clinic if symptoms persist or worsen. Patient is aware when to return to the clinic for a follow-up visit. Patient educated on when it is appropriate to go to the emergency department.   Harlow Maresiffany Lauriana Denes, FNP-C [facility] [address] [place], [place] [id number] [phone]

## 2020-03-15 ENCOUNTER — Other Ambulatory Visit: Payer: BLUE CROSS/BLUE SHIELD | Admitting: Adult Health

## 2020-03-30 ENCOUNTER — Ambulatory Visit: Payer: Self-pay | Admitting: Family Medicine

## 2020-04-05 ENCOUNTER — Ambulatory Visit: Payer: Self-pay | Admitting: Family Medicine

## 2020-04-07 ENCOUNTER — Encounter: Payer: Self-pay | Admitting: Family Medicine

## 2020-05-04 ENCOUNTER — Other Ambulatory Visit: Payer: Self-pay

## 2020-05-04 ENCOUNTER — Encounter: Payer: Self-pay | Admitting: Family Medicine

## 2020-05-04 ENCOUNTER — Ambulatory Visit (INDEPENDENT_AMBULATORY_CARE_PROVIDER_SITE_OTHER): Payer: Self-pay | Admitting: Family Medicine

## 2020-05-04 VITALS — BP 124/87 | HR 80 | Temp 97.7°F | Ht 62.0 in | Wt 191.0 lb

## 2020-05-04 DIAGNOSIS — F411 Generalized anxiety disorder: Secondary | ICD-10-CM

## 2020-05-12 ENCOUNTER — Encounter: Payer: Self-pay | Admitting: Family Medicine

## 2020-05-12 NOTE — Progress Notes (Signed)
Subjective:  Patient ID: Julie Kim, female    DOB: 1991/05/02  Age: 29 y.o. MRN: [id number]  CC: Anxiety (Follow up )   HPI Julie Kim presents for follow up of chronic anxiety. No new concerns noted today. She is due for refills of buspar and celexa. They are working adequately, but she feeels she still needs them. No side effects as reviewed.  Depression screen [facility] 2/9 05/04/2020 02/24/2020 10/27/2019  Decreased Interest 1 0 0  Down, Depressed, Hopeless 1 0 0  PHQ - 2 Score 2 0 0  Altered sleeping 1 - -  Tired, decreased energy 1 - -  Change in appetite 0 - -  Feeling bad or failure about yourself  0 - -  Trouble concentrating 0 - -  Moving slowly or fidgety/restless 1 - -  Suicidal thoughts 0 - -  PHQ-9 Score 5 - -    History Julie Kim has a past medical history of Anemia, Angio-edema, Anxiety, Arthritis, Endometriosis, Headache, Hypertension, Hypoglycemia, Low iron, and Urticaria.   She has a past surgical history that includes none and Laparoscopic ovarian cystectomy (N/A, 06/16/2016).   Her family history includes Allergy (severe) in her father and mother; Eczema in her father; Heart disease in her paternal uncle; Heart disease (age of onset: 0) in her brother; Psoriasis in her father.She reports that she is a non-smoker but has been exposed to tobacco smoke. She has never used smokeless tobacco. She reports that she does not drink alcohol and does not use drugs.    ROS Review of Systems  Constitutional: Negative.   HENT: Negative.   Eyes: Negative for visual disturbance.  Respiratory: Negative for shortness of breath.   Cardiovascular: Negative for chest pain.  Gastrointestinal: Negative for abdominal pain.  Musculoskeletal: Negative for arthralgias.  Psychiatric/Behavioral: Negative for agitation and sleep disturbance. The patient is not nervous/anxious and is not hyperactive.     Objective:  BP 124/87   Pulse 80   Temp 97.7 F (36.5 C) (Temporal)   Ht 5\' 2"   (1.575 m)   Wt 191 lb (86.6 kg)   BMI 34.93 kg/m   BP Readings from Last 3 Encounters:  05/04/20 124/87  02/24/20 123/89  10/27/19 126/82    Wt Readings from Last 3 Encounters:  05/04/20 191 lb (86.6 kg)  02/24/20 196 lb 6 oz (89.1 kg)  10/27/19 187 lb 6 oz (85 kg)     Physical Exam Constitutional:      General: She is not in acute distress.    Appearance: She is well-developed and well-nourished.  Cardiovascular:     Rate and Rhythm: Normal rate and regular rhythm.  Pulmonary:     Breath sounds: Normal breath sounds.  Skin:    General: Skin is warm and dry.  Neurological:     Mental Status: She is alert and oriented to person, place, and time.  Psychiatric:        Mood and Affect: Mood and affect normal.       Assessment & Plan:   Julie Kim was seen today for anxiety.  Diagnoses and all orders for this visit:  GAD (generalized anxiety disorder)       I am having Julie Kim maintain her EPINEPHrine, diphenhydrAMINE, fluticasone, citalopram, and busPIRone.  Allergies as of 05/04/2020      Reactions   Penicillins Hives   Has patient had a PCN reaction causing immediate rash, facial/tongue/throat swelling, SOB or lightheadedness with hypotension: Yes Has patient had a PCN reaction causing  severe rash involving mucus membranes or skin necrosis: No Has patient had a PCN reaction that required hospitalization No Has patient had a PCN reaction occurring within the last 10 years: No If all of the above answers are "NO", then may proceed with Cephalosporin use.   Alpha-gal       Medication List       Accurate as of May 04, 2020 11:59 PM. If you have any questions, ask your nurse or doctor.        busPIRone 5 MG tablet Commonly known as: BUSPAR Take 1 tablet (5 mg total) by mouth 3 (three) times daily.   citalopram 20 MG tablet Commonly known as: CeleXA Take 1 tablet (20 mg total) by mouth daily.   diphenhydrAMINE 25 MG tablet Commonly known as:  BENADRYL Take 25 mg by mouth every 6 (six) hours as needed.   EPINEPHrine 0.3 mg/0.3 mL Soaj injection Commonly known as: EPI-PEN INJECT CONTENTS OF 1 PEN AS NEEDED FOR ALLERGIC REACTION   fluticasone 50 MCG/ACT nasal spray Commonly known as: FLONASE Place 2 sprays into both nostrils daily.        Follow-up: No follow-ups on file.  Mechele Claude, M.D.

## 2020-05-17 ENCOUNTER — Telehealth: Payer: Self-pay | Admitting: Family Medicine

## 2020-05-18 ENCOUNTER — Telehealth (INDEPENDENT_AMBULATORY_CARE_PROVIDER_SITE_OTHER): Payer: Self-pay | Admitting: Family

## 2020-05-18 ENCOUNTER — Encounter: Payer: Self-pay | Admitting: Family

## 2020-05-18 ENCOUNTER — Telehealth: Payer: Self-pay

## 2020-05-18 DIAGNOSIS — H65191 Other acute nonsuppurative otitis media, right ear: Secondary | ICD-10-CM

## 2020-05-18 DIAGNOSIS — R6889 Other general symptoms and signs: Secondary | ICD-10-CM

## 2020-05-18 MED ORDER — CEFDINIR 300 MG PO CAPS: 300.0000 mg | ORAL_CAPSULE | Freq: Two times a day (BID) | ORAL | 0 refills | Status: DC

## 2020-05-18 NOTE — Patient Instructions (Signed)

## 2020-05-18 NOTE — Progress Notes (Signed)
   Virtual Visit via telephone Note Due to COVID-19 pandemic this visit was conducted virtually. This visit type was conducted due to national recommendations for restrictions regarding the COVID-19 Pandemic (e.g. social distancing, sheltering in place) in an effort to limit this patient's exposure and mitigate transmission in our community. All issues noted in this document were discussed and addressed.  A physical exam was not performed with this format.  I connected with Julie Kim on 05/18/20 at 11:13 AM  by telephone and verified that I am speaking with the correct person using two identifiers. Julie Kim is currently located at home and husband is currently with her during visit. The provider, Jannifer Rodney, FNP is located in their office at time of visit.  I discussed the limitations, risks, security and privacy concerns of performing an evaluation and management service by telephone and the availability of in person appointments. I also discussed with the patient that there may be a patient responsible charge related to this service. The patient expressed understanding and agreed to proceed.   History and Present Illness:   Pt calls the office today with flu like symptoms that started 5 days ago. She reports she took two COVID testes that were negative.   Otalgia  There is pain in the right ear. This is a new problem. The current episode started 1 to 4 weeks ago. The problem occurs constantly. The problem has been gradually worsening. The maximum temperature recorded prior to her arrival was 101 - 101.9 F. The pain is at a severity of 7/10. The pain is moderate. Associated symptoms include coughing, diarrhea, headaches and a sore throat. Pertinent negatives include no ear discharge or rhinorrhea. She has tried acetaminophen (OTC decongestant ) for the symptoms. The treatment provided mild relief.    Review of Systems  HENT: Positive for ear pain and sore throat. Negative for ear  discharge and rhinorrhea.   Respiratory: Positive for cough.   Gastrointestinal: Positive for diarrhea.  Neurological: Positive for headaches.     Observations/Objective: No SOB or distress noted   Assessment and Plan: 1. Flu-like symptoms Force fluids Rest Tylenol   2. Other acute nonsuppurative otitis media of right ear, recurrence not specified Will start Omnicef Discussed possible cross reaction, if she has a rash stop and call us.  Tylenol as needed  - cefdinir (OMNICEF) 300 MG capsule; Take 1 capsule (300 mg total) by mouth 2 (two) times daily. 1 po BID  Dispense: 20 capsule; Refill: 0     I discussed the assessment and treatment plan with the patient. The patient was provided an opportunity to ask questions and all were answered. The patient agreed with the plan and demonstrated an understanding of the instructions.   The patient was advised to call back or seek an in-person evaluation if the symptoms worsen or if the condition fails to improve as anticipated.  The above assessment and management plan was discussed with the patient. The patient verbalized understanding of and has agreed to the management plan. Patient is aware to call the clinic if symptoms persist or worsen. Patient is aware when to return to the clinic for a follow-up visit. Patient educated on when it is appropriate to go to the emergency department.   Time call ended:  11:25  AM   I provided 12 minutes of non-face-to-face time during this encounter.    Jannifer Rodney, FNP

## 2020-09-01 ENCOUNTER — Other Ambulatory Visit: Payer: Self-pay | Admitting: Family Medicine

## 2020-09-01 DIAGNOSIS — F411 Generalized anxiety disorder: Secondary | ICD-10-CM

## 2020-09-07 ENCOUNTER — Other Ambulatory Visit: Payer: Self-pay | Admitting: Family Medicine

## 2020-09-07 DIAGNOSIS — F411 Generalized anxiety disorder: Secondary | ICD-10-CM

## 2020-09-08 ENCOUNTER — Encounter: Payer: Self-pay | Admitting: Family Medicine

## 2020-09-09 ENCOUNTER — Other Ambulatory Visit: Payer: Self-pay | Admitting: *Deleted

## 2020-09-09 DIAGNOSIS — F411 Generalized anxiety disorder: Secondary | ICD-10-CM

## 2020-09-09 MED ORDER — CITALOPRAM HYDROBROMIDE 20 MG PO TABS: 20.0000 mg | ORAL_TABLET | Freq: Every day | ORAL | 1 refills | Status: DC

## 2020-10-21 ENCOUNTER — Telehealth: Payer: Self-pay | Admitting: Physician Assistant

## 2020-10-21 DIAGNOSIS — J019 Acute sinusitis, unspecified: Secondary | ICD-10-CM

## 2020-10-21 DIAGNOSIS — B9689 Other specified bacterial agents as the cause of diseases classified elsewhere: Secondary | ICD-10-CM

## 2020-10-21 MED ORDER — DOXYCYCLINE HYCLATE 100 MG PO TABS: 100.0000 mg | ORAL_TABLET | Freq: Two times a day (BID) | ORAL | 0 refills | Status: DC

## 2020-10-21 NOTE — Progress Notes (Signed)
We are sorry that you are not feeling well.  Here is how we plan to help!  Based on what you have shared with me it looks like you have sinusitis.  Sinusitis is inflammation and infection in the sinus cavities of the head.  Based on your presentation I believe you most likely have Acute Bacterial Sinusitis.  This is an infection caused by bacteria and is treated with antibiotics. I have prescribed Doxycycline 100mg by mouth twice a day for 10 days. You may use an oral decongestant such as Mucinex D or if you have glaucoma or high blood pressure use plain Mucinex. Saline nasal spray help and can safely be used as often as needed for congestion.  If you develop worsening sinus pain, fever or notice severe headache and vision changes, or if symptoms are not better after completion of antibiotic, please schedule an appointment with a health care provider.    Sinus infections are not as easily transmitted as other respiratory infection, however we still recommend that you avoid close contact with loved ones, especially the very young and elderly.  Remember to wash your hands thoroughly throughout the day as this is the number one way to prevent the spread of infection!  Home Care:  Only take medications as instructed by your medical team.  Complete the entire course of an antibiotic.  Do not take these medications with alcohol.  A steam or ultrasonic humidifier can help congestion.  You can place a towel over your head and breathe in the steam from hot water coming from a faucet.  Avoid close contacts especially the very young and the elderly.  Cover your mouth when you cough or sneeze.  Always remember to wash your hands.  Get Help Right Away If:  You develop worsening fever or sinus pain.  You develop a severe head ache or visual changes.  Your symptoms persist after you have completed your treatment plan.  Make sure you  Understand these instructions.  Will watch your  condition.  Will get help right away if you are not doing well or get worse.  Your e-visit answers were reviewed by a board certified advanced clinical practitioner to complete your personal care plan.  Depending on the condition, your plan could have included both over the counter or prescription medications.  If there is a problem please reply  once you have received a response from your provider.  Your safety is important to us.  If you have drug allergies check your prescription carefully.    You can use MyChart to ask questions about today's visit, request a non-urgent call back, or ask for a work or school excuse for 24 hours related to this e-Visit. If it has been greater than 24 hours you will need to follow up with your provider, or enter a new e-Visit to address those concerns.  You will get an e-mail in the next two days asking about your experience.  I Darbie that your e-visit has been valuable and will speed your recovery. Thank you for using e-visits.  I provided 6 minutes of non face-to-face time during this encounter for chart review and documentation.  

## 2020-11-02 ENCOUNTER — Ambulatory Visit: Payer: Self-pay | Admitting: Family Medicine

## 2020-11-18 ENCOUNTER — Encounter: Payer: Self-pay | Admitting: Family Medicine

## 2020-11-18 ENCOUNTER — Other Ambulatory Visit: Payer: Self-pay

## 2020-11-18 ENCOUNTER — Ambulatory Visit (INDEPENDENT_AMBULATORY_CARE_PROVIDER_SITE_OTHER): Payer: Self-pay | Admitting: Family Medicine

## 2020-11-18 VITALS — BP 120/86 | HR 76 | Temp 96.9°F | Ht 62.0 in | Wt 192.6 lb

## 2020-11-18 DIAGNOSIS — H938X2 Other specified disorders of left ear: Secondary | ICD-10-CM

## 2020-11-18 DIAGNOSIS — F411 Generalized anxiety disorder: Secondary | ICD-10-CM

## 2020-11-18 DIAGNOSIS — R5383 Other fatigue: Secondary | ICD-10-CM

## 2020-11-18 DIAGNOSIS — D500 Iron deficiency anemia secondary to blood loss (chronic): Secondary | ICD-10-CM

## 2020-11-18 MED ORDER — CITALOPRAM HYDROBROMIDE 20 MG PO TABS: 20.0000 mg | ORAL_TABLET | Freq: Every day | ORAL | 3 refills | Status: DC

## 2020-11-18 MED ORDER — FLUTICASONE PROPIONATE 50 MCG/ACT NA SUSP: 2.0000 | Freq: Every day | NASAL | 6 refills | Status: DC

## 2020-11-18 MED ORDER — BUSPIRONE HCL 5 MG PO TABS: 5.0000 mg | ORAL_TABLET | Freq: Three times a day (TID) | ORAL | 5 refills | Status: DC

## 2020-11-18 NOTE — Progress Notes (Signed)
Subjective:  Patient ID: Julie Kim, female    DOB: 01-Oct-1990  Age: 30 y.o. MRN: [id number]  CC: Medical Management of Chronic Issues   HPI Julie Kim presents for fatigue.  She i has noticed that her energy is falling off.  She is actively menstruating monthly.  She is concerned about a low hemoglobin and anemia.  Also in for follow-up on anxiety/depression.  See PHQ and GAD-7 below reviewed and confirmed with the patient Depression screen [facility] 2/9 11/18/2020 05/04/2020 02/24/2020  Decreased Interest 1 1 0  Down, Depressed, Hopeless 0 1 0  PHQ - 2 Score 1 2 0  Altered sleeping 0 1 -  Tired, decreased energy 1 1 -  Change in appetite 0 0 -  Feeling bad or failure about yourself  0 0 -  Trouble concentrating 0 0 -  Moving slowly or fidgety/restless 0 1 -  Suicidal thoughts 0 0 -  PHQ-9 Score 2 5 -  Difficult doing work/chores Not difficult at all - -   . GAD 7 : Generalized Anxiety Score 11/18/2020 05/04/2020 02/24/2020  Nervous, Anxious, on Edge 1 0 2  Control/stop worrying 1 0 3  Worry too much - different things 1 0 3  Trouble relaxing 1 0 3  Restless _0 Easily annoyed or irritable 1 0 3  Afraid - awful might happen 0 1 3  Total GAD 7 Score _1 Anxiety Difficulty Not difficult at all Not difficult at all -     History Julie Kim has a past medical history of Anemia, Angio-edema, Anxiety, Arthritis, Endometriosis, Headache, Hypertension, Hypoglycemia, Low iron, and Urticaria.   She has a past surgical history that includes none and Laparoscopic ovarian cystectomy (N/A, 06/16/2016).   Her family history includes Allergy (severe) in her father and mother; Eczema in her father; Heart disease in her paternal uncle; Heart disease (age of onset: 0) in her brother; Psoriasis in her father.She reports that she is a non-smoker but has been exposed to tobacco smoke. She has never used smokeless tobacco. She reports that she does not drink alcohol and does not use  drugs.    ROS Review of Systems  Constitutional: Negative.   HENT: Negative.    Eyes:  Negative for visual disturbance.  Respiratory:  Negative for shortness of breath.   Cardiovascular:  Negative for chest pain.  Gastrointestinal:  Negative for abdominal pain.  Musculoskeletal:  Negative for arthralgias.  Psychiatric/Behavioral:  Positive for dysphoric mood. The patient is nervous/anxious.    Objective:  BP 120/86   Pulse 76   Temp (!) 96.9 F (36.1 C)   Ht _2  (1.575 m)   Wt 192 lb 9.6 oz (87.4 kg)   SpO2 100%   BMI 35.23 kg/m   BP Readings from Last 3 Encounters:  11/18/20 120/86  05/04/20 124/87  02/24/20 123/89    Wt Readings from Last 3 Encounters:  11/18/20 192 lb 9.6 oz (87.4 kg)  05/04/20 191 lb (86.6 kg)  02/24/20 196 lb 6 oz (89.1 kg)     Physical Exam Constitutional:      General: She is not in acute distress.    Appearance: She is well-developed.  Cardiovascular:     Rate and Rhythm: Normal rate and regular rhythm.  Pulmonary:     Breath sounds: Normal breath sounds.  Musculoskeletal:        General: Normal range of motion.  Skin:    General: Skin is warm and dry.  Neurological:     Mental Status: She is alert and oriented to person, place, and time.      Assessment & Plan:   [name] was seen today for medical management of chronic issues.  Diagnoses and all orders for this visit:  Fatigue, unspecified type -     CMP14+EGFR -     CBC with Differential/Platelet -     TSH  GAD (generalized anxiety disorder) -     citalopram (CELEXA) 20 MG tablet; Take 1 tablet (20 mg total) by mouth daily. -     busPIRone (BUSPAR) 5 MG tablet; Take 1 tablet (5 mg total) by mouth 3 (three) times daily.  Ear congestion, left -     fluticasone (FLONASE) 50 MCG/ACT nasal spray; Place 2 sprays into both nostrils daily.  Iron deficiency anemia due to chronic blood loss      I have discontinued Julie Kreamer's doxycycline. I am also having her  maintain her EPINEPHrine, diphenhydrAMINE, citalopram, busPIRone, and fluticasone.  Allergies as of 11/18/2020       Reactions   Penicillins Hives   Has patient had a PCN reaction causing immediate rash, facial/tongue/throat swelling, SOB or lightheadedness with hypotension: Yes Has patient had a PCN reaction causing severe rash involving mucus membranes or skin necrosis: No Has patient had a PCN reaction that required hospitalization No Has patient had a PCN reaction occurring within the last 10 years: No If all of the above answers are "NO", then may proceed with Cephalosporin use.   Alpha-gal         Medication List        Accurate as of November 18, 2020 11:59 PM. If you have any questions, ask your nurse or doctor.          STOP taking these medications    doxycycline 100 MG tablet Commonly known as: VIBRA-TABS Stopped by: Claretta Fraise, MD       TAKE these medications    busPIRone 5 MG tablet Commonly known as: BUSPAR Take 1 tablet (5 mg total) by mouth 3 (three) times daily.   citalopram 20 MG tablet Commonly known as: CELEXA Take 1 tablet (20 mg total) by mouth daily.   diphenhydrAMINE 25 MG tablet Commonly known as: BENADRYL Take 25 mg by mouth every 6 (six) hours as needed.   EPINEPHrine 0.3 mg/0.3 mL Soaj injection Commonly known as: EPI-PEN INJECT CONTENTS OF 1 PEN AS NEEDED FOR ALLERGIC REACTION   fluticasone 50 MCG/ACT nasal spray Commonly known as: FLONASE Place 2 sprays into both nostrils daily.         Follow-up: Return in about 6 weeks (around 12/30/2020).  Claretta Fraise, M.D.

## 2020-11-19 ENCOUNTER — Other Ambulatory Visit: Payer: Self-pay | Admitting: Family Medicine

## 2020-11-19 ENCOUNTER — Encounter: Payer: Self-pay | Admitting: Family Medicine

## 2020-11-19 ENCOUNTER — Telehealth: Payer: Self-pay | Admitting: Family Medicine

## 2020-11-19 DIAGNOSIS — D649 Anemia, unspecified: Secondary | ICD-10-CM

## 2020-11-19 LAB — CMP14+EGFR
ALT: 14 [IU]/L (ref 0–32)
AST: 19 [IU]/L (ref 0–40)
Albumin/Globulin Ratio: 1.8 (ref 1.2–2.2)
Albumin: 4.8 g/dL (ref 3.9–5.0)
Alkaline Phosphatase: 103 [IU]/L (ref 44–121)
BUN/Creatinine Ratio: 30 — ABNORMAL HIGH (ref 9–23)
BUN: 18 mg/dL (ref 6–20)
Bilirubin Total: 0.2 mg/dL (ref 0.0–1.2)
CO2: 22 mmol/L (ref 20–29)
Calcium: 9.7 mg/dL (ref 8.7–10.2)
Chloride: 101 mmol/L (ref 96–106)
Creatinine, Ser: 0.6 mg/dL (ref 0.57–1.00)
Globulin, Total: 2.7 g/dL (ref 1.5–4.5)
Glucose: 78 mg/dL (ref 65–99)
Potassium: 4.5 mmol/L (ref 3.5–5.2)
Sodium: 137 mmol/L (ref 134–144)
Total Protein: 7.5 g/dL (ref 6.0–8.5)
eGFR: 125 mL/min/{1.73_m2} (ref >59)

## 2020-11-19 LAB — CBC WITH DIFFERENTIAL/PLATELET
Basophils Absolute: 0 10*3/uL (ref 0.0–0.2)
Basos: 1 %
EOS (ABSOLUTE): 0.2 10*3/uL (ref 0.0–0.4)
Eos: 3 %
Hematocrit: 28.4 % — ABNORMAL LOW (ref 34.0–46.6)
Hemoglobin: 8.8 g/dL — CL (ref 11.1–15.9)
Immature Grans (Abs): 0 10*3/uL (ref 0.0–0.1)
Immature Granulocytes: 0 %
Lymphocytes Absolute: 1.5 10*3/uL (ref 0.7–3.1)
Lymphs: 25 %
MCH: 21.8 pg — ABNORMAL LOW (ref 26.6–33.0)
MCHC: 31 g/dL — ABNORMAL LOW (ref 31.5–35.7)
MCV: 71 fL — ABNORMAL LOW (ref 79–97)
Monocytes Absolute: 0.3 10*3/uL (ref 0.1–0.9)
Monocytes: 5 %
Neutrophils Absolute: 4 10*3/uL (ref 1.4–7.0)
Neutrophils: 66 %
Platelets: 327 10*3/uL (ref 150–450)
RBC: 4.03 x10E6/uL (ref 3.77–5.28)
RDW: 15.5 % — ABNORMAL HIGH (ref 11.7–15.4)
WBC: 6 10*3/uL (ref 3.4–10.8)

## 2020-11-19 LAB — TSH: TSH: 2.83 u[IU]/mL (ref 0.450–4.500)

## 2020-11-19 NOTE — Telephone Encounter (Signed)
Patient aware and verbalized understanding. Patient states she has heavy periods no other signs of bleeding will watch for that. Has alpha gal so cant eat a lot of red meat. She will keep a check on thinks and wait for anemia panel

## 2020-11-19 NOTE — Telephone Encounter (Signed)
Called pt but no answer and VM is full, can't leave message.    Tiffany covering for [employer]-results from yesterday show hgb level of 8.8-spoke with lab to add on anemia panel.  Pt needs to start iron supplement daily and need to assess pt for active bleeding or dizziness to see if she needs follow up ASAP.

## 2020-11-24 LAB — ANEMIA PROFILE B
Ferritin: 7 ng/mL — ABNORMAL LOW (ref 15–150)
Folate: 9 ng/mL (ref >3.0)
Iron Saturation: 5 % — CL (ref 15–55)
Iron: 23 ug/dL — ABNORMAL LOW (ref 27–159)
Total Iron Binding Capacity: 422 ug/dL (ref 250–450)
UIBC: 399 ug/dL (ref 131–425)
Vitamin B-12: 328 pg/mL (ref 232–1245)

## 2020-11-24 LAB — SPECIMEN STATUS REPORT

## 2020-12-06 ENCOUNTER — Encounter: Payer: Self-pay | Admitting: Adult Health

## 2020-12-27 ENCOUNTER — Other Ambulatory Visit (HOSPITAL_COMMUNITY)
Admission: RE | Admit: 2020-12-27 | Discharge: 2020-12-27 | Disposition: A | Discharge status: Home / Self Care | Payer: Self-pay | Source: Ambulatory Visit | Attending: Adult Health | Admitting: Adult Health

## 2020-12-27 ENCOUNTER — Ambulatory Visit: Payer: Self-pay | Admitting: Adult Health

## 2020-12-27 ENCOUNTER — Encounter: Payer: Self-pay | Admitting: Adult Health

## 2020-12-27 ENCOUNTER — Other Ambulatory Visit: Payer: Self-pay

## 2020-12-27 VITALS — BP 123/87 | HR 78 | Ht 63.25 in | Wt 198.5 lb

## 2020-12-27 DIAGNOSIS — Z01419 Encounter for gynecological examination (general) (routine) without abnormal findings: Secondary | ICD-10-CM | POA: Insufficient documentation

## 2020-12-27 DIAGNOSIS — Z8742 Personal history of other diseases of the female genital tract: Secondary | ICD-10-CM | POA: Insufficient documentation

## 2020-12-27 DIAGNOSIS — Z319 Encounter for procreative management, unspecified: Secondary | ICD-10-CM | POA: Insufficient documentation

## 2020-12-27 NOTE — Progress Notes (Signed)
Patient ID: Julie Kim, female   DOB: March 01, 1991, 30 y.o.   MRN: [id number] History of Present Illness:  Julie Kim is a 30 year old white female, married, G0P0, in for a well woman gyn exam and pap, and wants to get pregnant. She had laparoscopic surgery in 2018 and had endometrioma removed from ovary and found to have endometriosis and had adhesions released from ovary and tube, had +dye spill.  PCP is Dr Darlyn Read.  Current Medications, Allergies, Past Medical History, Past Surgical History, Family History and Social History were reviewed in [employer] record.     Review of Systems: Patient denies any headaches, hearing loss, fatigue, blurred vision, shortness of breath, chest pain, abdominal pain, problems with bowel movements, urination, or intercourse. No joint pain or mood swings.  Periods heavy for 3 days and lasts 5-7 days, occasional [pain with sex.   Physical Exam:BP 123/87 (BP Location: Left Arm, Patient Position: Sitting, Cuff Size: Normal)   Pulse 78   Ht 5' 3.25" (1.607 m)   Wt 198 lb 8 oz (90 kg)   LMP 12/20/2020 (Exact Date)   BMI 34.89 kg/m   General:  Well developed, well nourished, no acute distress Skin:  Warm and dry Neck:  Midline trachea, normal thyroid, good ROM, no lymphadenopathy Lungs; Clear to auscultation bilaterally Breast:  No dominant palpable mass, retraction, or nipple discharge Cardiovascular: Regular rate and rhythm Abdomen:  Soft, non tender, no hepatosplenomegaly Pelvic:  External genitalia is normal in appearance, no lesions.  The vagina is normal in appearance. Urethra has no lesions or masses. The cervix is smooth, pap with HR HPV genotyping performed.  Uterus is felt to be normal size, shape, and contour.  No adnexal masses or tenderness noted.Bladder is non tender, no masses felt. Rectal:Deferred Extremities/musculoskeletal:  No swelling or varicosities noted, no clubbing or cyanosis Psych:  No mood changes, alert and  cooperative,seems happy AA is 0 Fall risk is low Depression screen [facility] 2/9 12/27/2020 11/18/2020 05/04/2020  Decreased Interest 1 1 1   Down, Depressed, Hopeless 0 0 1  PHQ - 2 Score 1 1 2   Altered sleeping 2 0 1  Tired, decreased energy 2 1 1   Change in appetite 0 0 0  Feeling bad or failure about yourself  0 0 0  Trouble concentrating 0 0 0  Moving slowly or fidgety/restless 0 0 1  Suicidal thoughts 0 0 0  PHQ-9 Score 5 2 5   Difficult doing work/chores - Not difficult at all -    GAD 7 : Generalized Anxiety Score 12/27/2020 11/18/2020 05/04/2020 02/24/2020  Nervous, Anxious, on Edge 1 1 0 2  Control/stop worrying 1 1 0 3  Worry too much - different things 0 1 0 3  Trouble relaxing 0 1 0 3  Restless 0 2 1 3   Easily annoyed or irritable 0 1 0 3  Afraid - awful might happen 0 0 1 3  Total GAD 7 Score 2 7 2 20   Anxiety Difficulty - Not difficult at all Not difficult at all -      Upstream - 12/27/20 1037       Pregnancy Intention Screening   Does the patient want to become pregnant in the next year? Yes    Does the patient's partner want to become pregnant in the next year? Yes    Would the patient like to discuss contraceptive options today? Yes      Contraception Wrap Up   Current Method Pregnant/Seeking Pregnancy  End Method Pregnant/Seeking Pregnancy            Examination chaperoned by Malachy Mood LPN   Impression and Plan: 1. Encounter for gynecological examination with Papanicolaou smear of cervix Pap sent Physical in 1 year Pap in 3 if normal   2. Patient desires pregnancy Take OTC PNV Will check progesterone level 01/10/21 Have sex every other night days 7-24 of cycle Discussed possibly using femara for ovulation if not ovulating May need semen analysis, in future   3. History of endometriosis

## 2020-12-28 LAB — CYTOLOGY - PAP
Comment: NEGATIVE
Diagnosis: NEGATIVE
High risk HPV: NEGATIVE

## 2021-01-05 ENCOUNTER — Other Ambulatory Visit: Payer: Self-pay | Admitting: Adult Health

## 2021-04-08 ENCOUNTER — Encounter: Payer: Self-pay | Admitting: Family Medicine

## 2021-04-08 ENCOUNTER — Encounter: Payer: Self-pay | Admitting: Nurse Practitioner

## 2021-04-08 ENCOUNTER — Ambulatory Visit (INDEPENDENT_AMBULATORY_CARE_PROVIDER_SITE_OTHER): Payer: Self-pay | Admitting: Nurse Practitioner

## 2021-04-08 VITALS — Temp 100.1°F

## 2021-04-08 DIAGNOSIS — J069 Acute upper respiratory infection, unspecified: Secondary | ICD-10-CM

## 2021-04-08 LAB — VERITOR FLU A/B WAIVED
Influenza A: NEGATIVE
Influenza B: NEGATIVE

## 2021-04-08 NOTE — Progress Notes (Signed)
   Virtual Visit  Note Due to COVID-19 pandemic this visit was conducted virtually. This visit type was conducted due to national recommendations for restrictions regarding the COVID-19 Pandemic (e.g. social distancing, sheltering in place) in an effort to limit this patient's exposure and mitigate transmission in our community. All issues noted in this document were discussed and addressed.  A physical exam was not performed with this format.  I connected with Julie Kim on 04/08/21 at 10:30 AM by telephone and verified that I am speaking with the correct person using two identifiers. Julie Kim is currently located at home is currently  during visit. The provider, Daryll Drown, NP is located in their office at time of visit.  I discussed the limitations, risks, security and privacy concerns of performing an evaluation and management service by telephone and the availability of in person appointments. I also discussed with the patient that there may be a patient responsible charge related to this service. The patient expressed understanding and agreed to proceed.   History and Present Illness:  URI  This is a new problem. The current episode started yesterday. The problem has been gradually worsening. The maximum temperature recorded prior to her arrival was 100.4 - 100.9 F. Associated symptoms include congestion, coughing, headaches and a sore throat. Pertinent negatives include no ear pain. She has tried nothing for the symptoms.     Review of Systems  Constitutional:  Positive for chills, fever and malaise/fatigue.  HENT:  Positive for congestion and sore throat. Negative for ear pain.   Respiratory:  Positive for cough.   Neurological:  Positive for headaches.  All other systems reviewed and are negative.   Observations/Objective: Televisit patient not in distress  Assessment and Plan: Take meds as prescribed - Use a cool mist humidifier  -Use saline nose sprays  frequently -Force fluids -For fever or aches or pains- take Tylenol or ibuprofen. -Completed flu swab results pending -If symptoms do not improve, she may need to be COVID tested to rule this out Follow up with worsening unresolved symptoms   Follow Up Instructions: Follow-up with unresolved symptoms    I discussed the assessment and treatment plan with the patient. The patient was provided an opportunity to ask questions and all were answered. The patient agreed with the plan and demonstrated an understanding of the instructions.   The patient was advised to call back or seek an in-person evaluation if the symptoms worsen or if the condition fails to improve as anticipated.  The above assessment and management plan was discussed with the patient. The patient verbalized understanding of and has agreed to the management plan. Patient is aware to call the clinic if symptoms persist or worsen. Patient is aware when to return to the clinic for a follow-up visit. Patient educated on when it is appropriate to go to the emergency department.   Time call ended: 10:40 AM  I provided 10 minutes of  non face-to-face time during this encounter.    Daryll Drown, NP

## 2021-04-08 NOTE — Assessment & Plan Note (Signed)
Take meds as prescribed - Use a cool mist humidifier  -Use saline nose sprays frequently -Force fluids -For fever or aches or pains- take Tylenol or ibuprofen. -Completed flu swab results pending -If symptoms do not improve, she may need to be COVID tested to rule this out Follow up with worsening unresolved symptoms

## 2021-04-08 NOTE — Patient Instructions (Signed)

## 2021-04-11 ENCOUNTER — Other Ambulatory Visit: Payer: Self-pay | Admitting: Family Medicine

## 2021-04-11 MED ORDER — AZITHROMYCIN 250 MG PO TABS: ORAL_TABLET | ORAL | 0 refills | Status: DC

## 2021-06-06 ENCOUNTER — Encounter: Payer: Self-pay | Admitting: Family Medicine

## 2021-07-07 ENCOUNTER — Ambulatory Visit (INDEPENDENT_AMBULATORY_CARE_PROVIDER_SITE_OTHER): Payer: Self-pay | Admitting: Family Medicine

## 2021-07-07 ENCOUNTER — Encounter: Payer: Self-pay | Admitting: Family Medicine

## 2021-07-07 DIAGNOSIS — J0100 Acute maxillary sinusitis, unspecified: Secondary | ICD-10-CM

## 2021-07-07 MED ORDER — DOXYCYCLINE HYCLATE 100 MG PO TABS: 100.0000 mg | ORAL_TABLET | Freq: Two times a day (BID) | ORAL | 0 refills | Status: AC

## 2021-07-07 NOTE — Progress Notes (Signed)
Virtual Visit via Telephone Note  I connected with Julie Kim on 07/07/21 at 5:19 PM by telephone and verified that I am speaking with the correct person using two identifiers. Julie Kim is currently located at the hair salon and nobody is currently with her during this visit. The provider, Gwenlyn Fudge, FNP is located in their office at time of visit.  I discussed the limitations, risks, security and privacy concerns of performing an evaluation and management service by telephone and the availability of in person appointments. I also discussed with the patient that there may be a patient responsible charge related to this service. The patient expressed understanding and agreed to proceed.  Subjective: PCP: Mechele Claude, MD  Chief Complaint  Patient presents with   URI   Patient complains of cough, head congestion, headache, runny nose, sneezing, and facial pain/pressure.  Onset of symptoms was 6 days ago, gradually worsening since that time. She is drinking plenty of fluids. Evaluation to date: at home COVID test negative. Treatment to date:  Mucinex . She does not smoke.    ROS: Per HPI  Current Outpatient Medications:    azithromycin (ZITHROMAX Z-PAK) 250 MG tablet, Take two right away Then one a day for the next 4 days., Disp: 6 each, Rfl: 0   busPIRone (BUSPAR) 5 MG tablet, Take 1 tablet (5 mg total) by mouth 3 (three) times daily. (Patient taking differently: Take 5 mg by mouth.), Disp: 30 tablet, Rfl: 5   citalopram (CELEXA) 20 MG tablet, Take 1 tablet (20 mg total) by mouth daily., Disp: 90 tablet, Rfl: 3   diphenhydrAMINE (BENADRYL) 25 MG tablet, Take 25 mg by mouth every 6 (six) hours as needed., Disp: , Rfl:    EPINEPHrine 0.3 mg/0.3 mL IJ SOAJ injection, INJECT CONTENTS OF 1 PEN AS NEEDED FOR ALLERGIC REACTION, Disp: , Rfl: 0   Ferrous Sulfate (IRON PO), Take by mouth., Disp: , Rfl:    fluticasone (FLONASE) 50 MCG/ACT nasal spray, Place 2 sprays into both nostrils  daily., Disp: 16 g, Rfl: 6  Allergies  Allergen Reactions   Penicillins Hives    Has patient had a PCN reaction causing immediate rash, facial/tongue/throat swelling, SOB or lightheadedness with hypotension: Yes Has patient had a PCN reaction causing severe rash involving mucus membranes or skin necrosis: No Has patient had a PCN reaction that required hospitalization No Has patient had a PCN reaction occurring within the last 10 years: No If all of the above answers are "NO", then may proceed with Cephalosporin use.    Alpha-Gal    Past Medical History:  Diagnosis Date   Anemia    Angio-edema    Anxiety    Arthritis    Endometriosis    Endometriosis    Headache    Migraines   Hypertension    while taking an anxiety pill, no longer taking that medication   Hypoglycemia    Low iron    Urticaria     Observations/Objective: A&O  No respiratory distress or wheezing audible over the phone Mood, judgement, and thought processes all WNL  Assessment and Plan: 1. Acute non-recurrent maxillary sinusitis Discussed symptom management.  - doxycycline (VIBRA-TABS) 100 MG tablet; Take 1 tablet (100 mg total) by mouth 2 (two) times daily for 7 days.  Dispense: 14 tablet; Refill: 0   Follow Up Instructions:  I discussed the assessment and treatment plan with the patient. The patient was provided an opportunity to ask questions and all were answered.  The patient agreed with the plan and demonstrated an understanding of the instructions.   The patient was advised to call back or seek an in-person evaluation if the symptoms worsen or if the condition fails to improve as anticipated.  The above assessment and management plan was discussed with the patient. The patient verbalized understanding of and has agreed to the management plan. Patient is aware to call the clinic if symptoms persist or worsen. Patient is aware when to return to the clinic for a follow-up visit. Patient educated on  when it is appropriate to go to the emergency department.   Time call ended: 5:30 PM  I provided 11 minutes of non-face-to-face time during this encounter.  Deliah Boston, MSN, APRN, FNP-C [facility] 07/07/21

## 2021-07-27 ENCOUNTER — Ambulatory Visit (INDEPENDENT_AMBULATORY_CARE_PROVIDER_SITE_OTHER): Payer: Self-pay | Admitting: Nurse Practitioner

## 2021-07-27 ENCOUNTER — Encounter: Payer: Self-pay | Admitting: Nurse Practitioner

## 2021-07-27 VITALS — BP 130/85 | HR 98 | Temp 99.6°F | Ht 63.0 in | Wt 192.0 lb

## 2021-07-27 DIAGNOSIS — J029 Acute pharyngitis, unspecified: Secondary | ICD-10-CM

## 2021-07-27 DIAGNOSIS — J019 Acute sinusitis, unspecified: Secondary | ICD-10-CM

## 2021-07-27 DIAGNOSIS — B9689 Other specified bacterial agents as the cause of diseases classified elsewhere: Secondary | ICD-10-CM

## 2021-07-27 DIAGNOSIS — R051 Acute cough: Secondary | ICD-10-CM

## 2021-07-27 LAB — RAPID STREP SCREEN (MED CTR MEBANE ONLY): Strep Gp A Ag, IA W/Reflex: POSITIVE — AB

## 2021-07-27 MED ORDER — GUAIFENESIN ER 600 MG PO TB12: 600.0000 mg | ORAL_TABLET | Freq: Two times a day (BID) | ORAL | 0 refills | Status: DC

## 2021-07-27 MED ORDER — DOXYCYCLINE HYCLATE 100 MG PO TABS: 100.0000 mg | ORAL_TABLET | Freq: Two times a day (BID) | ORAL | 0 refills | Status: DC

## 2021-07-27 MED ORDER — DOXYCYCLINE HYCLATE 100 MG PO TABS
100.0000 mg | ORAL_TABLET | Freq: Two times a day (BID) | ORAL | 0 refills | Status: DC
Start: 2021-07-27 — End: 2021-07-27

## 2021-07-27 NOTE — Patient Instructions (Signed)
Sore Throat When you have a sore throat, your throat may feel: Tender. Burning. Irritated. Scratchy. Painful when you swallow. Painful when you talk. Many things can cause a sore throat, such as: An infection. Allergies. Dry air. Smoke or pollution. Radiation treatment for cancer. Gastroesophageal reflux disease (GERD). A tumor. A sore throat can be the first sign of another sickness. It can happen with other problems, like: Coughing. Sneezing. Fever. Swelling of the glands in the neck. Most sore throats go away without treatment. Follow these instructions at home:   Medicines Take over-the-counter and prescription medicines only as told by your doctor. Children often get sore throats. Do not give your child aspirin. Use throat sprays to soothe your throat as told by your health care provider. Managing pain To help with pain: Sip warm liquids, such as broth, herbal tea, or warm water. Eat or drink cold or frozen liquids, such as frozen ice pops. Rinse your mouth (gargle) with a salt water mixture 3-4 times a day or as needed. To make salt water, dissolve -1 tsp (3-6 g) of salt in 1 cup (237 mL) of warm water. Do not swallow this mixture. Suck on hard candy or throat lozenges. Put a cool-mist humidifier in your bedroom at night. Sit in the bathroom with the door closed for 5-10 minutes while you run hot water in the shower. General instructions Do not smoke or use any products that contain nicotine or tobacco. If you need help quitting, ask your doctor. Get plenty of rest. Drink enough fluid to keep your pee (urine) pale yellow. Wash your hands often for at least 20 seconds with soap and water. If soap and water are not available, use hand sanitizer. Contact a doctor if: You have a fever for more than 2-3 days. You keep having symptoms for more than 2-3 days. Your throat does not get better in 7 days. You have a fever and your symptoms suddenly get worse. Your child  who is 3 months to 3 years old has a temperature of 102.2F (39C) or higher. Get help right away if: You have trouble breathing. You cannot swallow fluids, soft foods, or your spit. You have swelling in your throat or neck that gets worse. You feel like you may vomit (nauseous) and this feeling lasts a long time. You cannot stop vomiting. These symptoms may be an emergency. Get help right away. Call your local emergency services (911 in the [place] Do not wait to see if the symptoms will go away. Do not drive yourself to the hospital. Summary A sore throat is a painful, burning, irritated, or scratchy throat. Many things can cause a sore throat. Take over-the-counter medicines only as told by your doctor. Get plenty of rest. Drink enough fluid to keep your pee (urine) pale yellow. Contact a doctor if your symptoms get worse or your sore throat does not get better within 7 days. This information is not intended to replace advice given to you by your health care provider. Make sure you discuss any questions you have with your health care provider. Document Revised: 08/04/2020 Document Reviewed: 08/04/2020 Elsevier Patient Education  2022 Elsevier Inc.  

## 2021-07-27 NOTE — Progress Notes (Signed)
Acute Office Visit  Subjective:    Patient ID: Julie Kim, female    DOB: 1991-05-17, 31 y.o.   MRN: [id number]  Chief Complaint  Patient presents with   Cough    Cough This is a new problem. The current episode started yesterday. The problem has been unchanged. The problem occurs constantly. The cough is Productive of sputum. Associated symptoms include headaches, nasal congestion and a sore throat. Pertinent negatives include no chills or ear congestion. The treatment provided no relief.  Sore Throat  This is a new problem. The current episode started yesterday. The problem has been gradually worsening. Neither side of throat is experiencing more pain than the other. There has been no fever. The fever has been present for Less than 1 day. Associated symptoms include congestion, coughing, headaches and swollen glands. She has had exposure to strep.    Past Medical History:  Diagnosis Date   Anemia    Angio-edema    Anxiety    Arthritis    Endometriosis    Endometriosis    Headache    Migraines   Hypertension    while taking an anxiety pill, no longer taking that medication   Hypoglycemia    Low iron    Urticaria     Past Surgical History:  Procedure Laterality Date   LAPAROSCOPIC OVARIAN CYSTECTOMY N/A 06/16/2016   Procedure: LAPAROSCOPIC OVARIAN CYSTECTOMY;  Surgeon: Sanjuana Kava, MD;  Location: [facility];  Service: Gynecology;  Laterality: N/A;   none      Family History  Problem Relation Age of Onset   Kidney disease Maternal Grandfather    Allergy (severe) Father        nylon, lanolin, fragrances, hot dogs.    Eczema Father    Psoriasis Father    Allergy (severe) Mother        penicillin   Heart disease Brother 0       valve doesn't open and close properl   Heart disease Paternal Uncle     Social History   Socioeconomic History   Marital status: Married    Spouse name: Not on file   Number of children: Not on file   Years of education: Not on file    Highest education level: Not on file  Occupational History   Not on file  Tobacco Use   Smoking status: Never    Passive exposure: Yes   Smokeless tobacco: Never  Vaping Use   Vaping Use: Never used  Substance and Sexual Activity   Alcohol use: No    Alcohol/week: 0.0 standard drinks   Drug use: No   Sexual activity: Yes    Birth control/protection: None  Other Topics Concern   Not on file  Social History Narrative   Not on file   Social Determinants of Health   Financial Resource Strain: Low Risk    Difficulty of Paying Living Expenses: Not hard at all  Food Insecurity: No Food Insecurity   Worried About Charity fundraiser in the Last Year: Never true   [place] in the Last Year: Never true  Transportation Needs: No Transportation Needs   Lack of Transportation (Medical): No   Lack of Transportation (Non-Medical): No  Physical Activity: Inactive   Days of Exercise per Week: 0 days   Minutes of Exercise per Session: 0 min  Stress: Stress Concern Present   Feeling of Stress : To some extent  Social Connections: Moderately Integrated   Frequency of  Communication with Friends and Family: More than three times a week   Frequency of Social Gatherings with Friends and Family: More than three times a week   Attends Religious Services: More than 4 times per year   Active Member of [employer] or Organizations: No   Attends Archivist Meetings: Never   Marital Status: Married  Human resources officer Violence: Not At Risk   Fear of Current or Ex-Partner: No   Emotionally Abused: No   Physically Abused: No   Sexually Abused: No    Outpatient Medications Prior to Visit  Medication Sig Dispense Refill   busPIRone (BUSPAR) 5 MG tablet Take 1 tablet (5 mg total) by mouth 3 (three) times daily. (Patient taking differently: Take 5 mg by mouth.) 30 tablet 5   citalopram (CELEXA) 20 MG tablet Take 1 tablet (20 mg total) by mouth daily. 90 tablet 3   diphenhydrAMINE (BENADRYL)  25 MG tablet Take 25 mg by mouth every 6 (six) hours as needed.     EPINEPHrine 0.3 mg/0.3 mL IJ SOAJ injection INJECT CONTENTS OF 1 PEN AS NEEDED FOR ALLERGIC REACTION  0   Ferrous Sulfate (IRON PO) Take by mouth.     fluticasone (FLONASE) 50 MCG/ACT nasal spray Place 2 sprays into both nostrils daily. 16 g 6   azithromycin (ZITHROMAX Z-PAK) 250 MG tablet Take two right away Then one a day for the next 4 days. 6 each 0   No facility-administered medications prior to visit.    Allergies  Allergen Reactions   Penicillins Hives    Has patient had a PCN reaction causing immediate rash, facial/tongue/throat swelling, SOB or lightheadedness with hypotension: Yes Has patient had a PCN reaction causing severe rash involving mucus membranes or skin necrosis: No Has patient had a PCN reaction that required hospitalization No Has patient had a PCN reaction occurring within the last 10 years: No If all of the above answers are "NO", then may proceed with Cephalosporin use.    Alpha-Gal     Review of Systems  Constitutional:  Negative for chills.  HENT:  Positive for congestion and sore throat.   Respiratory:  Positive for cough.   Neurological:  Positive for headaches.  All other systems reviewed and are negative.     Objective:    Physical Exam Vitals and nursing note reviewed.  Constitutional:      Appearance: She is normal weight.  HENT:     Head: Normocephalic.     Right Ear: External ear normal.     Left Ear: External ear normal.     Nose: Congestion present.     Mouth/Throat:     Mouth: Mucous membranes are moist.     Pharynx: Oropharynx is clear.  Eyes:     Conjunctiva/sclera: Conjunctivae normal.  Cardiovascular:     Rate and Rhythm: Normal rate and regular rhythm.     Pulses: Normal pulses.     Heart sounds: Normal heart sounds.  Pulmonary:     Effort: Pulmonary effort is normal.     Breath sounds: Normal breath sounds.  Abdominal:     General: Bowel sounds are  normal.  Musculoskeletal:        General: Normal range of motion.  Skin:    General: Skin is warm.     Findings: No rash.  Neurological:     Mental Status: She is alert and oriented to person, place, and time.    BP 130/85    Pulse 98  Temp 99.6 F (37.6 C) (Oral)    Ht _0  (1.6 m)    Wt 192 lb (87.1 kg)    LMP 07/13/2021 (Approximate)    SpO2 95%    BMI 34.01 kg/m  Wt Readings from Last 3 Encounters:  07/27/21 192 lb (87.1 kg)  12/27/20 198 lb 8 oz (90 kg)  11/18/20 192 lb 9.6 oz (87.4 kg)    Health Maintenance Due  Topic Date Due   COVID-19 Vaccine (1) Never done   Hepatitis C Screening  Never done   TETANUS/TDAP  06/21/2015   INFLUENZA VACCINE  Never done    There are no preventive care reminders to display for this patient.   Lab Results  Component Value Date   TSH 2.830 11/18/2020   Lab Results  Component Value Date   WBC 6.0 11/18/2020   WBC UTPADD 11/18/2020   HGB 8.8 (LL) 11/18/2020   HGB CANCELED 11/18/2020   HCT 28.4 (L) 11/18/2020   HCT CANCELED 11/18/2020   MCV 71 (L) 11/18/2020   PLT 327 11/18/2020   PLT CANCELED 11/18/2020   Lab Results  Component Value Date   NA 137 11/18/2020   K 4.5 11/18/2020   CO2 22 11/18/2020   GLUCOSE 78 11/18/2020   BUN 18 11/18/2020   CREATININE 0.60 11/18/2020   BILITOT 0.2 11/18/2020   ALKPHOS 103 11/18/2020   AST 19 11/18/2020   ALT 14 11/18/2020   PROT 7.5 11/18/2020   ALBUMIN 4.8 11/18/2020   CALCIUM 9.7 11/18/2020   ANIONGAP 11 03/09/2017   EGFR 125 11/18/2020   Lab Results  Component Value Date   CHOL 161 10/10/2016   Lab Results  Component Value Date   HDL 31 (L) 10/10/2016   Lab Results  Component Value Date   LDLCALC 80 10/10/2016   Lab Results  Component Value Date   TRIG 250 (H) 10/10/2016   Lab Results  Component Value Date   CHOLHDL 5.2 (H) 10/10/2016   No results found for: HGBA1C     Assessment & Plan:  Take meds as prescribed - Use a cool mist humidifier  -Use saline  nose sprays frequently -Force fluids -For fever or aches or pains- take Tylenol or ibuprofen. -Positive strep -Mucinex for cough and congestion. -If symptoms do not improve, she may need to be COVID tested to rule this out Follow up with worsening unresolved symptoms  Problem List Items Addressed This Visit   None Visit Diagnoses     Sore throat    -  Primary   Relevant Medications   doxycycline (VIBRA-TABS) 100 MG tablet   Other Relevant Orders   Rapid Strep Screen ([facility] ONLY)   Acute bacterial sinusitis       Relevant Medications   doxycycline (VIBRA-TABS) 100 MG tablet   guaiFENesin (MUCINEX) 600 MG 12 hr tablet   Acute cough       Relevant Medications   guaiFENesin (MUCINEX) 600 MG 12 hr tablet        Meds ordered this encounter  Medications   DISCONTD: doxycycline (VIBRA-TABS) 100 MG tablet    Sig: Take 1 tablet (100 mg total) by mouth 2 (two) times daily.    Dispense:  14 tablet    Refill:  0    Order Specific Question:   Supervising Provider    Answer:   Claretta Fraise [[id number]]   doxycycline (VIBRA-TABS) 100 MG tablet    Sig: Take 1 tablet (100 mg total) by mouth 2 (  two) times daily.    Dispense:  20 tablet    Refill:  0    Order Specific Question:   Supervising Provider    Answer:   Claretta Fraise [[id number]]   guaiFENesin (MUCINEX) 600 MG 12 hr tablet    Sig: Take 1 tablet (600 mg total) by mouth 2 (two) times daily.    Dispense:  30 tablet    Refill:  0    Order Specific Question:   Supervising Provider    Answer:   Claretta Fraise [[id number]]     Ivy Lynn, NP

## 2021-09-22 ENCOUNTER — Encounter: Payer: Self-pay | Admitting: Family Medicine

## 2021-09-23 ENCOUNTER — Ambulatory Visit (INDEPENDENT_AMBULATORY_CARE_PROVIDER_SITE_OTHER): Payer: Self-pay | Admitting: Family Medicine

## 2021-09-23 ENCOUNTER — Encounter: Payer: Self-pay | Admitting: Family Medicine

## 2021-09-23 VITALS — BP 118/76 | HR 81 | Temp 97.9°F | Ht 63.0 in | Wt 202.2 lb

## 2021-09-23 DIAGNOSIS — N750 Cyst of Bartholin's gland: Secondary | ICD-10-CM

## 2021-09-23 MED ORDER — SULFAMETHOXAZOLE-TRIMETHOPRIM 800-160 MG PO TABS: 1.0000 | ORAL_TABLET | Freq: Two times a day (BID) | ORAL | 0 refills | Status: AC

## 2021-09-23 NOTE — Progress Notes (Signed)
? ?  Acute Office Visit ? ?Subjective:  ? ?  ?Patient ID: Julie Kim, female    DOB: 1990/05/28, 31 y.o.   MRN: [id number] ? ?Chief Complaint  ?Patient presents with  ? Recurrent Skin Infections  ? ? ?HPI ?Patient is in today for a boil on her left outer labia. She noticed tenderness as well last night. Denies fever, drainage, vaginal discharge, or itching. She denies concerns for STDs. She has a history of boils but has not had one in this location before.  ? ?ROS ?As per HPI. ? ?   ?Objective:  ?  ?BP 118/76   Pulse 81   Temp 97.9 ?F (36.6 ?C) (Temporal)   Ht 5\' 3"  (1.6 m)   Wt 202 lb 4 oz (91.7 kg)   BMI 35.83 kg/m?  ? ? ?Physical Exam ?Vitals and nursing note reviewed.  ?Constitutional:   ?   General: She is not in acute distress. ?   Appearance: She is not ill-appearing, toxic-appearing or diaphoretic.  ?Pulmonary:  ?   Effort: Pulmonary effort is normal. No respiratory distress.  ?Genitourinary: ?   Labia:     ?   Right: No rash.     ?   Left: Lesion (bartholin cyst present with tenderness. No drainage or fluctuance present) present. No rash.   ?Skin: ?   General: Skin is warm and dry.  ?Neurological:  ?   General: No focal deficit present.  ?   Mental Status: She is alert and oriented to person, place, and time.  ?Psychiatric:     ?   Mood and Affect: Mood normal.     ?   Behavior: Behavior normal.  ? ? ?No results found for any visits on 09/23/21. ? ? ?   ?Assessment & Plan:  ? ?Jacquelynne was seen today for recurrent skin infections. ? ?Diagnoses and all orders for this visit: ? ?Bartholin's cyst ?Discussed warm compresses, sitz baths. Bactrin as below. Follow up with GYN if no improvement or worsening in symptoms.  ?-     sulfamethoxazole-trimethoprim (BACTRIM DS) 800-160 MG tablet; Take 1 tablet by mouth 2 (two) times daily for 7 days. ? ?Return if symptoms worsen or fail to improve. ? ?The patient indicates understanding of these issues and agrees with the plan. ? ?11/23/21, FNP ? ? ?

## 2021-09-23 NOTE — Patient Instructions (Signed)
Bartholin's Cyst  A Bartholin's cyst is a fluid-filled sac that forms as a result of a blockage along the tube (duct) of the Bartholin's gland. Bartholin's glands are small glands in the folds of skin around the vaginal opening (labia). These glands produce fluid to moisten or lubricate the outside of the vagina during sex. A cyst that is not large or infected may not cause any problems or require treatment. If the cyst gets infected with bacteria, it is called a Bartholin's abscess. An abscess may cause symptoms such as pain and swelling and is more likely to require treatment. What are the causes? This condition may be caused by a blocked Bartholin's gland duct. These ducts can become blocked due to natural buildup of fluid and oils. Bacteria inside of the cyst can cause infection. In many cases, the cause is not known. What are the signs or symptoms? Symptoms may include: A bulge or lump on the labia, near the lower opening of the vagina. Discomfort or pain. This may get worse during sex or when walking. Redness, swelling, or fluid draining from the area. These may be signs of an abscess. How severe your symptoms are depends on the size of your cyst and whether it is infected. Infection causes symptoms to get more severe. How is this diagnosed? This condition may be diagnosed based on: Your symptoms and medical history. A physical exam to check for swelling in your vaginal area. You may lie on your back on an exam table and have your feet placed into footrests for the exam. Blood tests to check for infections. Removal of a fluid sample from the cyst or abscess (biopsy) for testing. You may work with a health care provider who specializes in women's health (gynecologist) for diagnosis and treatment. How is this treated? If your cyst is small, not infected, and not causing symptoms, you may not need treatment. These cysts often go away on their own, with home care such as hot baths or warm  compresses. If you have a large cyst or an abscess, treatment may include: Antibiotic medicine. A procedure to drain the fluid inside the cyst or abscess. These procedures involve making an incision in the cyst or abscess so that the fluid drains out, and then one of the following may be done: A small, thin tube (catheter) may be placed inside the cyst or abscess so that it does not close and fill up with fluid again (fistulization). The catheter will be removed at a follow-up visit. The edges of the incision may be stitched to your skin so that the cyst or abscess stays open (marsupialization). This allows it to continue to drain and not fill up with fluid again. If you have cysts or abscesses that keep returning (recurring) and have required incision and drainage multiple times, your health care provider may talk with you about surgery to remove the Bartholin's gland. Follow these instructions at home: Medicines Take over-the-counter and prescription medicines only as told by your health care provider. If you were prescribed an antibiotic medicine, take it as told by your health care provider. Do not stop taking the antibiotic even if your condition improves. Managing pain and swelling Try sitz baths to help with pain and swelling. A sitz bath is a warm water bath in which the water only comes up to your hips and should cover your buttocks. You may take sitz baths several times a day. Apply heat to the affected area as often as needed. Use the heat   source that your health care provider recommends, such as a moist heat pack or a heating pad. Place a towel between your skin and the heat source. Leave the heat on for 20-30 minutes. Remove the heat if your skin turns bright red. This is especially important if you are unable to feel pain, heat, or cold. You may have a greater risk of getting burned. Do not fall asleep with the heating pad in place. General instructions If your cyst or abscess was  drained, follow instructions from your health care provider about how to take care of your wound. Use feminine pads as needed to absorb any drainage. Do not push on or squeeze your cyst. Do not have sex until the cyst has gone away or your wound from drainage has healed. Take these steps to help prevent a Bartholin's cyst from returning and to prevent other Bartholin's cysts from developing: Take a bath or shower once a day. Clean your vaginal area with mild soap and water when you bathe. Practice safe sex to prevent STIs. Talk with your health care provider about how to prevent STIs and which forms of birth control (contraception) may be best for you. Keep all follow-up visits. This is important. Contact a health care provider if: You have a fever. You develop increasing redness, swelling, or pain around your cyst. You have fluid, blood, pus, or a bad smell coming from your cyst. You have a cyst that gets larger or comes back. Summary A Bartholin's cyst is a fluid-filled sac that forms as a result of a blockage along the duct of the Bartholin's gland. If your cyst is small, not infected, and not causing symptoms, you may not need any treatment. If you have a large cyst or an abscess, your health care provider may perform a procedure to drain the fluid. If you have cysts or abscesses that keep returning (recurring) and have required incision and drainage multiple times, your health care provider may talk with you about surgery to remove the Bartholin's gland. This information is not intended to replace advice given to you by your health care provider. Make sure you discuss any questions you have with your health care provider. Document Revised: 10/06/2019 Document Reviewed: 10/06/2019 Elsevier Patient Education  2023 Elsevier Inc.  

## 2021-11-16 ENCOUNTER — Encounter: Payer: Self-pay | Admitting: Family Medicine

## 2021-11-16 ENCOUNTER — Telehealth (INDEPENDENT_AMBULATORY_CARE_PROVIDER_SITE_OTHER): Payer: Self-pay | Admitting: Family Medicine

## 2021-11-16 DIAGNOSIS — N764 Abscess of vulva: Secondary | ICD-10-CM

## 2021-11-16 MED ORDER — SULFAMETHOXAZOLE-TRIMETHOPRIM 800-160 MG PO TABS: 1.0000 | ORAL_TABLET | Freq: Two times a day (BID) | ORAL | 0 refills | Status: AC

## 2021-11-16 NOTE — Progress Notes (Signed)
Virtual Visit via MyChart Video Note Due to COVID-19 pandemic this visit was conducted virtually. This visit type was conducted due to national recommendations for restrictions regarding the COVID-19 Pandemic (e.g. social distancing, sheltering in place) in an effort to limit this patient's exposure and mitigate transmission in our community. All issues noted in this document were discussed and addressed.  A physical exam was not performed with this format.   I connected with Julie Kim on 11/16/2021 at 1145 by MyChart Video and verified that I am speaking with the correct person using two identifiers. Julie Kim is currently located at hotel and family is currently with them during visit. The provider, Kari Baars, FNP is located in their office at time of visit.  I discussed the limitations, risks, security and privacy concerns of performing an evaluation and management service by virtual visit and the availability of in person appointments. I also discussed with the patient that there may be a patient responsible charge related to this service. The patient expressed understanding and agreed to proceed.  Subjective:  Patient ID: Julie Kim, female    DOB: [birth year], 31 y.o.   MRN: [id number]  Chief Complaint:  Abscess   HPI: Julie Kim is a 31 y.o. female presenting on 11/16/2021 for Abscess   Pt reports a labial abscess. States started after shaving her bikini area. States swollen, red, tender to touch. No drainage. No systemic signs of infection.      Relevant past medical, surgical, family, and social history reviewed and updated as indicated.  Allergies and medications reviewed and updated.   Past Medical History:  Diagnosis Date   Anemia    Angio-edema    Anxiety    Arthritis    Endometriosis    Endometriosis    Headache    Migraines   Hypertension    while taking an anxiety pill, no longer taking that medication   Hypoglycemia    Low iron    Urticaria      Past Surgical History:  Procedure Laterality Date   LAPAROSCOPIC OVARIAN CYSTECTOMY N/A 06/16/2016   Procedure: LAPAROSCOPIC OVARIAN CYSTECTOMY;  Surgeon: Essie Hart, MD;  Location: [facility];  Service: Gynecology;  Laterality: N/A;   none      Social History   Socioeconomic History   Marital status: Married    Spouse name: Not on file   Number of children: Not on file   Years of education: Not on file   Highest education level: Not on file  Occupational History   Not on file  Tobacco Use   Smoking status: Never    Passive exposure: Yes   Smokeless tobacco: Never  Vaping Use   Vaping Use: Never used  Substance and Sexual Activity   Alcohol use: No    Alcohol/week: 0.0 standard drinks of alcohol   Drug use: No   Sexual activity: Yes    Birth control/protection: None  Other Topics Concern   Not on file  Social History Narrative   Not on file   Social Determinants of Health   Financial Resource Strain: Low Risk  (12/27/2020)   Overall Financial Resource Strain (CARDIA)    Difficulty of Paying Living Expenses: Not hard at all  Food Insecurity: No Food Insecurity (12/27/2020)   Hunger Vital Sign    Worried About Running Out of Food in the Last Year: Never true    Ran Out of Food in the Last Year: Never true  Transportation Needs: No Transportation Needs (12/27/2020)  PRAPARE - Administrator, Civil Service (Medical): No    Lack of Transportation (Non-Medical): No  Physical Activity: Inactive (12/27/2020)   Exercise Vital Sign    Days of Exercise per Week: 0 days    Minutes of Exercise per Session: 0 min  Stress: Stress Concern Present (12/27/2020)   [employer] of Occupational Health - Occupational Stress Questionnaire    Feeling of Stress : To some extent  Social Connections: Moderately Integrated (12/27/2020)   Social Connection and Isolation Panel [NHANES]    Frequency of Communication with Friends and Family: More than three times a week    Frequency of  Social Gatherings with Friends and Family: More than three times a week    Attends Religious Services: More than 4 times per year    Active Member of [employer] or Organizations: No    Attends Banker Meetings: Never    Marital Status: Married  Catering manager Violence: Not At Risk (12/27/2020)   Humiliation, Afraid, Rape, and Kick questionnaire    Fear of Current or Ex-Partner: No    Emotionally Abused: No    Physically Abused: No    Sexually Abused: No    Outpatient Encounter Medications as of 11/16/2021  Medication Sig   sulfamethoxazole-trimethoprim (BACTRIM DS) 800-160 MG tablet Take 1 tablet by mouth 2 (two) times daily for 7 days.   busPIRone (BUSPAR) 5 MG tablet Take 1 tablet (5 mg total) by mouth 3 (three) times daily. (Patient taking differently: Take 5 mg by mouth.)   citalopram (CELEXA) 20 MG tablet Take 1 tablet (20 mg total) by mouth daily.   diphenhydrAMINE (BENADRYL) 25 MG tablet Take 25 mg by mouth every 6 (six) hours as needed.   EPINEPHrine 0.3 mg/0.3 mL IJ SOAJ injection INJECT CONTENTS OF 1 PEN AS NEEDED FOR ALLERGIC REACTION   Ferrous Sulfate (IRON PO) Take by mouth.   fluticasone (FLONASE) 50 MCG/ACT nasal spray Place 2 sprays into both nostrils daily.   guaiFENesin (MUCINEX) 600 MG 12 hr tablet Take 1 tablet (600 mg total) by mouth 2 (two) times daily.   No facility-administered encounter medications on file as of 11/16/2021.    Allergies  Allergen Reactions   Penicillins Hives    Has patient had a PCN reaction causing immediate rash, facial/tongue/throat swelling, SOB or lightheadedness with hypotension: Yes Has patient had a PCN reaction causing severe rash involving mucus membranes or skin necrosis: No Has patient had a PCN reaction that required hospitalization No Has patient had a PCN reaction occurring within the last 10 years: No If all of the above answers are "NO", then may proceed with Cephalosporin use.    Alpha-Gal     Review of Systems   Constitutional:  Negative for activity change, appetite change, chills, diaphoresis, fatigue, fever and unexpected weight change.  HENT: Negative.    Eyes: Negative.   Respiratory:  Negative for cough, chest tightness and shortness of breath.   Cardiovascular:  Negative for chest pain, palpitations and leg swelling.  Gastrointestinal:  Negative for abdominal pain, blood in stool, constipation, diarrhea, nausea and vomiting.  Endocrine: Negative.   Genitourinary:  Negative for decreased urine volume, difficulty urinating, dysuria, frequency and urgency.  Musculoskeletal:  Negative for arthralgias and myalgias.  Skin:  Positive for color change and wound. Negative for pallor and rash.  Allergic/Immunologic: Negative.   Neurological:  Negative for dizziness, weakness and headaches.  Hematological: Negative.   Psychiatric/Behavioral:  Negative for confusion, hallucinations, sleep disturbance and  suicidal ideas.   All other systems reviewed and are negative.        Observations/Objective: No vital signs or physical exam, this was a virtual health encounter.  Pt alert and oriented, answers all questions appropriately, and able to speak in full sentences.    Assessment and Plan: Julie Kim was seen today for abscess.  Diagnoses and all orders for this visit:  Labial abscess Symptomatic care discussed in detail. Avoid using same razor more than once. Use antiseptic wipe after shaving. Medications as prescribed. Report any new, worsening, or persistent symptoms.  -     sulfamethoxazole-trimethoprim (BACTRIM DS) 800-160 MG tablet; Take 1 tablet by mouth 2 (two) times daily for 7 days.     Follow Up Instructions: Return if symptoms worsen or fail to improve.    I discussed the assessment and treatment plan with the patient. The patient was provided an opportunity to ask questions and all were answered. The patient agreed with the plan and demonstrated an understanding of the instructions.    The patient was advised to call back or seek an in-person evaluation if the symptoms worsen or if the condition fails to improve as anticipated.  The above assessment and management plan was discussed with the patient. The patient verbalized understanding of and has agreed to the management plan. Patient is aware to call the clinic if they develop any new symptoms or if symptoms persist or worsen. Patient is aware when to return to the clinic for a follow-up visit. Patient educated on when it is appropriate to go to the emergency department.    I provided 15 minutes of time during this MyChart Video encounter.   Kari Baars, FNP-C [facility] [address] [place], [place] [address] [phone] 11/16/2021

## 2021-11-22 ENCOUNTER — Other Ambulatory Visit: Payer: Self-pay | Admitting: Family Medicine

## 2021-11-22 DIAGNOSIS — F411 Generalized anxiety disorder: Secondary | ICD-10-CM

## 2021-12-14 ENCOUNTER — Encounter: Payer: Self-pay | Admitting: Family Medicine

## 2021-12-14 ENCOUNTER — Ambulatory Visit (INDEPENDENT_AMBULATORY_CARE_PROVIDER_SITE_OTHER): Payer: Self-pay | Admitting: Family Medicine

## 2021-12-14 VITALS — BP 118/81 | HR 82 | Temp 97.1°F | Ht 63.0 in | Wt 193.2 lb

## 2021-12-14 DIAGNOSIS — N7689 Other specified inflammation of vagina and vulva: Secondary | ICD-10-CM

## 2021-12-14 DIAGNOSIS — F411 Generalized anxiety disorder: Secondary | ICD-10-CM

## 2021-12-14 DIAGNOSIS — J020 Streptococcal pharyngitis: Secondary | ICD-10-CM

## 2021-12-14 DIAGNOSIS — H1033 Unspecified acute conjunctivitis, bilateral: Secondary | ICD-10-CM

## 2021-12-14 LAB — RAPID STREP SCREEN (MED CTR MEBANE ONLY): Strep Gp A Ag, IA W/Reflex: POSITIVE — AB

## 2021-12-14 MED ORDER — PSEUDOEPHEDRINE HCL 30 MG PO TABS: 30.0000 mg | ORAL_TABLET | ORAL | 0 refills | Status: DC | PRN

## 2021-12-14 MED ORDER — CITALOPRAM HYDROBROMIDE 20 MG PO TABS: 20.0000 mg | ORAL_TABLET | Freq: Every day | ORAL | 0 refills | Status: DC

## 2021-12-14 MED ORDER — DOXYCYCLINE HYCLATE 100 MG PO TABS: 100.0000 mg | ORAL_TABLET | Freq: Two times a day (BID) | ORAL | 0 refills | Status: DC

## 2021-12-14 MED ORDER — OLOPATADINE HCL 0.1 % OP SOLN: 1.0000 [drp] | Freq: Two times a day (BID) | OPHTHALMIC | 12 refills | Status: DC | PRN

## 2021-12-14 MED ORDER — AZITHROMYCIN 250 MG PO TABS: ORAL_TABLET | ORAL | 0 refills | Status: DC

## 2021-12-14 NOTE — Patient Instructions (Addendum)
You are OVERDUE for a physical with Dr Darlyn Read. I have sent 1 month I have sent in Azithromycin (start today). If your vaginal symptoms do not improve after finishing this antibiotic. Ok to start the Doxycycline, which was also sent.  Strep Throat, Adult Strep throat is an infection of the throat. It is caused by germs (bacteria). Strep throat is common during the cold months of the year. It mostly affects children who are 32-31 years old. However, people of all ages can get it at any time of the year. This infection spreads from person to person through coughing, sneezing, or having close contact. What are the causes? This condition is caused by the Streptococcus pyogenes germ. What increases the risk? You care for young children. Children are more likely to get strep throat and may spread it to others. You go to crowded places. Germs can spread easily in such places. You kiss or touch someone who has strep throat. What are the signs or symptoms? Fever or chills. Redness, swelling, or pain in the tonsils or throat. Pain or trouble when swallowing. White or yellow spots on the tonsils or throat. Tender glands in the neck and under the jaw. Bad breath. Red rash all over the body. This is rare. How is this treated? Medicines that kill germs (antibiotics). Medicines that treat pain or fever. These include: Ibuprofen or acetaminophen. Aspirin, only for people who are over the age of 35. Cough drops. Throat sprays. Follow these instructions at home: Medicines  Take over-the-counter and prescription medicines only as told by your doctor. Take your antibiotic medicine as told by your doctor. Do not stop taking the antibiotic even if you start to feel better. Eating and drinking  If you have trouble swallowing, eat soft foods until your throat feels better. Drink enough fluid to keep your pee (urine) pale yellow. To help with pain, you may have: Warm fluids, such as soup and tea. Cold  fluids, such as frozen desserts or popsicles. General instructions Rinse your mouth (gargle) with a salt-water mixture 3-4 times a day or as needed. To make a salt-water mixture, dissolve -1 tsp (3-6 g) of salt in 1 cup (237 mL) of warm water. Rest as much as you can. Stay home from work or school until you have been taking antibiotics for 24 hours. Do not smoke or use any products that contain nicotine or tobacco. If you need help quitting, ask your doctor. Keep all follow-up visits. How is this prevented?  Do not share food, drinking cups, or personal items. They can cause the germs to spread. Wash your hands well with soap and water. Make sure that all people in your house wash their hands well. Have family members tested if they have a fever or a sore throat. They may need an antibiotic if they have strep throat. Contact a doctor if: You have swelling in your neck that keeps getting bigger. You get a rash, cough, or earache. You cough up a thick fluid that is green, yellow-brown, or bloody. You have pain that does not get better with medicine. Your symptoms get worse instead of getting better. You have a fever. Get help right away if: You vomit. You have a very bad headache. Your neck hurts or feels stiff. You have chest pain or are short of breath. You have drooling, very bad throat pain, or changes in your voice. Your neck is swollen, or the skin gets red and tender. Your mouth is dry, or you are  peeing less than normal. You keep feeling more tired or have trouble waking up. Your joints are red or painful. These symptoms may be an emergency. Do not wait to see if the symptoms will go away. Get help right away. Call your local emergency services (911 in the [place] Summary Strep throat is an infection of the throat. It is caused by germs (bacteria). This infection can spread from person to person through coughing, sneezing, or having close contact. Take your medicines, including  antibiotics, as told by your doctor. Do not stop taking the antibiotic even if you start to feel better. To prevent the spread of germs, wash your hands well with soap and water. Have others do the same. Do not share food, drinking cups, or personal items. Get help right away if you have a bad headache, chest pain, shortness of breath, a stiff or painful neck, or you vomit. This information is not intended to replace advice given to you by your health care provider. Make sure you discuss any questions you have with your health care provider. Document Revised: 08/31/2020 Document Reviewed: 08/31/2020 Elsevier Patient Education  2023 Elsevier Inc.   Skin Abscess  A skin abscess is an infected area of your skin that contains pus and other material. An abscess can happen in any part of your body. Some abscesses break open (rupture) on their own. Most continue to get worse unless they are treated. The infection can spread deeper into the body and into your blood, which can make you feel sick. A skin abscess is caused by germs that enter the skin through a cut or scrape. It can also be caused by blocked oil and sweat glands or infected hair follicles. This condition is usually treated by: Draining the pus. Taking antibiotic medicines. Placing a warm, wet washcloth over the abscess. Follow these instructions at home: Medicines  Take over-the-counter and prescription medicines only as told by your doctor. If you were prescribed an antibiotic medicine, take it as told by your doctor. Do not stop taking the antibiotic even if you start to feel better. Abscess care  If you have an abscess that has not drained, place a warm, clean, wet washcloth over the abscess several times a day. Do this as told by your doctor. Follow instructions from your doctor about how to take care of your abscess. Make sure you: Cover the abscess with a bandage (dressing). Change your bandage or gauze as told by your  doctor. Wash your hands with soap and water before you change the bandage or gauze. If you cannot use soap and water, use hand sanitizer. Check your abscess every day for signs that the infection is getting worse. Check for: More redness, swelling, or pain. More fluid or blood. Warmth. More pus or a bad smell. General instructions To avoid spreading the infection: Do not share personal care items, towels, or hot tubs with others. Avoid making skin-to-skin contact with other people. Keep all follow-up visits as told by your doctor. This is important. Contact a doctor if: You have more redness, swelling, or pain around your abscess. You have more fluid or blood coming from your abscess. Your abscess feels warm when you touch it. You have more pus or a bad smell coming from your abscess. Your muscles ache. You feel sick. Get help right away if: You have very bad (severe) pain. You see red streaks on your skin spreading away from the abscess. You see redness that spreads quickly. You have a  fever or chills. Summary A skin abscess is an infected area of your skin that contains pus and other material. The abscess is caused by germs that enter the skin through a cut or scrape. It can also be caused by blocked oil and sweat glands or infected hair follicles. Follow your doctor's instructions on caring for your abscess, taking medicines, preventing infections, and keeping follow-up visits. This information is not intended to replace advice given to you by your health care provider. Make sure you discuss any questions you have with your health care provider. Document Revised: 02/14/2021 Document Reviewed: 02/14/2021 Elsevier Patient Education  2023 [employer].

## 2021-12-14 NOTE — Progress Notes (Signed)
Subjective: ME:QAST PCP: Julie Claude, MD [id number] Julie Kim is a 31 y.o. female presenting to clinic today for:  1.  URI Patient reports onset of sneezing Saturday evening.  On Sunday she became ill with a scratchy throat and had an episode of vomiting after drinking milkshake.  She reports fatigue, burning and drainage of her eyes bilaterally.  Sometimes she feels like her heart is racing.  She has also a recurrent vaginal boil.  She has been seen a couple of times for various vaginal boils but has not yet seen her GYN in [place].  She notes that these do seem to improve after use of antibiotics.  Does not report any pelvic pain, drainage, fevers, known sick contacts.  Currently utilizing Alka-Seltzer, Mucinex Benadryl and Flonase for her URI symptoms.  Last menstrual cycle was at the beginning of July.  No chance of pregnancy.   ROS: Per HPI  Allergies  Allergen Reactions   Penicillins Hives    Has patient had a PCN reaction causing immediate rash, facial/tongue/throat swelling, SOB or lightheadedness with hypotension: Yes Has patient had a PCN reaction causing severe rash involving mucus membranes or skin necrosis: No Has patient had a PCN reaction that required hospitalization No Has patient had a PCN reaction occurring within the last 10 years: No If all of the above answers are "NO", then may proceed with Cephalosporin use.    Alpha-Gal    Past Medical History:  Diagnosis Date   Anemia    Angio-edema    Anxiety    Arthritis    Endometriosis    Endometriosis    Headache    Migraines   Hypertension    while taking an anxiety pill, no longer taking that medication   Hypoglycemia    Low iron    Urticaria     Current Outpatient Medications:    busPIRone (BUSPAR) 5 MG tablet, Take 1 tablet (5 mg total) by mouth 3 (three) times daily. (Patient taking differently: Take 5 mg by mouth.), Disp: 30 tablet, Rfl: 5   citalopram (CELEXA) 20 MG tablet, Take 1 tablet (20 mg  total) by mouth daily., Disp: 90 tablet, Rfl: 3   diphenhydrAMINE (BENADRYL) 25 MG tablet, Take 25 mg by mouth every 6 (six) hours as needed., Disp: , Rfl:    EPINEPHrine 0.3 mg/0.3 mL IJ SOAJ injection, INJECT CONTENTS OF 1 PEN AS NEEDED FOR ALLERGIC REACTION, Disp: , Rfl: 0   Ferrous Sulfate (IRON PO), Take by mouth., Disp: , Rfl:    fluticasone (FLONASE) 50 MCG/ACT nasal spray, Place 2 sprays into both nostrils daily., Disp: 16 g, Rfl: 6   guaiFENesin (MUCINEX) 600 MG 12 hr tablet, Take 1 tablet (600 mg total) by mouth 2 (two) times daily., Disp: 30 tablet, Rfl: 0 Social History   Socioeconomic History   Marital status: Married    Spouse name: Not on file   Number of children: Not on file   Years of education: Not on file   Highest education level: Not on file  Occupational History   Not on file  Tobacco Use   Smoking status: Never    Passive exposure: Yes   Smokeless tobacco: Never  Vaping Use   Vaping Use: Never used  Substance and Sexual Activity   Alcohol use: No    Alcohol/week: 0.0 standard drinks of alcohol   Drug use: No   Sexual activity: Yes    Birth control/protection: None  Other Topics Concern   Not on file  Social History Narrative   Not on file   Social Determinants of Health   Financial Resource Strain: Low Risk  (12/27/2020)   Overall Financial Resource Strain (CARDIA)    Difficulty of Paying Living Expenses: Not hard at all  Food Insecurity: No Food Insecurity (12/27/2020)   Hunger Vital Sign    Worried About Running Out of Food in the Last Year: Never true    Ran Out of Food in the Last Year: Never true  Transportation Needs: No Transportation Needs (12/27/2020)   PRAPARE - Administrator, Civil Service (Medical): No    Lack of Transportation (Non-Medical): No  Physical Activity: Inactive (12/27/2020)   Exercise Vital Sign    Days of Exercise per Week: 0 days    Minutes of Exercise per Session: 0 min  Stress: Stress Concern Present (12/27/2020)    [employer] of Occupational Health - Occupational Stress Questionnaire    Feeling of Stress : To some extent  Social Connections: Moderately Integrated (12/27/2020)   Social Connection and Isolation Panel [NHANES]    Frequency of Communication with Friends and Family: More than three times a week    Frequency of Social Gatherings with Friends and Family: More than three times a week    Attends Religious Services: More than 4 times per year    Active Member of [employer] or Organizations: No    Attends Banker Meetings: Never    Marital Status: Married  Catering manager Violence: Not At Risk (12/27/2020)   Humiliation, Afraid, Rape, and Kick questionnaire    Fear of Current or Ex-Partner: No    Emotionally Abused: No    Physically Abused: No    Sexually Abused: No   Family History  Problem Relation Age of Onset   Kidney disease Maternal Grandfather    Allergy (severe) Father        nylon, lanolin, fragrances, hot dogs.    Eczema Father    Psoriasis Father    Allergy (severe) Mother        penicillin   Heart disease Brother 0       valve doesn't open and close properl   Heart disease Paternal Uncle     Objective: Office vital signs reviewed. BP 118/81   Pulse 82   Temp (!) 97.1 F (36.2 C)   Ht 5\' 3"  (1.6 m)   Wt 193 lb 3.2 oz (87.6 kg)   LMP 11/20/2021   SpO2 96%   BMI 34.22 kg/m   Physical Examination:  General: Awake, alert, nontoxic morbidly obese female, No acute distress HEENT: Normal    Neck: No masses palpated. No lymphadenopathy    Ears: Tympanic membranes intact, normal light reflex, no erythema, no bulging    Eyes: PERRLA, extraocular membranes intact, sclera minimally injected.  No significant exudates or purulence within the eyes appreciated    Nose: nasal turbinates moist, clear nasal discharge    Throat: moist mucus membranes, moderate oropharyngeal erythema, no tonsillar exudate.  Airway is patent Cardio: regular rate and rhythm, S1S2  heard, no murmurs appreciated Pulm: clear to auscultation bilaterally, no wheezes, rhonchi or rales; normal work of breathing on room air GU: Exam deferred  Assessment/ Plan: 31 y.o. female   Strep pharyngitis - Plan: Novel Coronavirus, NAA ([facility]), Rapid Strep Screen ([facility] ONLY), pseudoephedrine (SUDAFED) 30 MG tablet, azithromycin (ZITHROMAX) 250 MG tablet  Acute conjunctivitis of both eyes, unspecified acute conjunctivitis type - Plan: olopatadine (PATANOL) 0.1 % ophthalmic solution  Vagina boil - doxycycline (VIBRA-TABS) 100 MG tablet  GAD (generalized anxiety disorder) - Plan: citalopram (CELEXA) 20 MG tablet  Strep positive.  Z-Pak sent given penicillin allergy.  Sudafed for sinus congestion.  I have sent in Patanol for the conjunctivitis that she is experiencing.  Does not appear to be bacterial.  For the vaginal boil.  This is a recurrent issue.  Discussed that this is probably exacerbated by diet, weight.  Highly recommend that she follow-up with OB/GYN if this continues to be an issue.  Doxycycline given to use after the Z-Pak if symptoms are not resolved.  Warm compresses recommended.  Did not discuss anxiety in detail but needed refills on Celexa.  She has been overdue for follow-up with PCP since June of this year.  I recommend that she schedule her annual checkup so that she can have appropriate refills on all of her medicines  No orders of the defined types were placed in this encounter.  No orders of the defined types were placed in this encounter.    Raliegh Ip, DO [facility] [phone]

## 2021-12-15 LAB — NOVEL CORONAVIRUS, NAA: SARS-CoV-2, NAA: DETECTED — AB

## 2022-02-20 ENCOUNTER — Encounter: Payer: Self-pay | Admitting: Family Medicine

## 2022-02-20 ENCOUNTER — Telehealth: Payer: Self-pay | Admitting: Family Medicine

## 2022-02-20 ENCOUNTER — Other Ambulatory Visit: Payer: Self-pay | Admitting: Family Medicine

## 2022-02-20 DIAGNOSIS — F411 Generalized anxiety disorder: Secondary | ICD-10-CM

## 2022-02-20 MED ORDER — CITALOPRAM HYDROBROMIDE 20 MG PO TABS: 20.0000 mg | ORAL_TABLET | Freq: Every day | ORAL | 0 refills | Status: DC

## 2022-02-20 NOTE — Telephone Encounter (Signed)
  Prescription Request  02/20/2022  Is this a "Controlled Substance" medicine? no  Have you seen your PCP in the last 2 weeks? Appt made for 11/2   If YES, route message to pool  -  If NO, patient needs to be scheduled for appointment.  What is the name of the medication or equipment? citalopram (CELEXA) 20 MG tablet  Have you contacted your pharmacy to request a refill? Yes    Which pharmacy would you like this sent to? [facility] in [place]   Patient notified that their request is being sent to the clinical staff for review and that they should receive a response within 2 business days.

## 2022-02-20 NOTE — Telephone Encounter (Signed)
Closing encounter, refill sent from MyChart message

## 2022-03-15 ENCOUNTER — Telehealth: Payer: Self-pay | Admitting: Family Medicine

## 2022-03-15 NOTE — Telephone Encounter (Signed)
Need to see her in person since I haven't seen her in over a year

## 2022-03-15 NOTE — Telephone Encounter (Signed)
Called and let pt know , she is fine with coming in the office next week for her apt

## 2022-03-23 ENCOUNTER — Encounter: Payer: Self-pay | Admitting: Family Medicine

## 2022-03-23 ENCOUNTER — Ambulatory Visit (INDEPENDENT_AMBULATORY_CARE_PROVIDER_SITE_OTHER): Payer: Self-pay | Admitting: Family Medicine

## 2022-03-23 DIAGNOSIS — F411 Generalized anxiety disorder: Secondary | ICD-10-CM

## 2022-03-23 MED ORDER — EPINEPHRINE 0.3 MG/0.3ML IJ SOAJ: INTRAMUSCULAR | 0 refills | Status: DC

## 2022-03-23 MED ORDER — CITALOPRAM HYDROBROMIDE 20 MG PO TABS: 20.0000 mg | ORAL_TABLET | Freq: Every day | ORAL | 3 refills | Status: DC

## 2022-03-23 MED ORDER — BUSPIRONE HCL 5 MG PO TABS: 5.0000 mg | ORAL_TABLET | Freq: Three times a day (TID) | ORAL | 3 refills | Status: DC

## 2022-03-23 NOTE — Progress Notes (Signed)
Subjective:  Patient ID: Julie Kim, female    DOB: 1990-06-21  Age: 31 y.o. MRN: [id number]  CC: Anxiety and Medication Refill   HPI Julie Kim presents for follow up of anxiety. 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 Meds keeping anxiety well controlled.      03/23/2022    1:58 PM 03/23/2022    1:57 PM 12/14/2021    9:05 AM  Depression screen PHQ 2/9  Decreased Interest 1 0 0  Down, Depressed, Hopeless 0 0 0  PHQ - 2 Score 1 0 0  Altered sleeping 1    Tired, decreased energy 3    Change in appetite 0    Feeling bad or failure about yourself  0    Trouble concentrating 0    Moving slowly or fidgety/restless 0    Suicidal thoughts 0    PHQ-9 Score 5    Difficult doing work/chores Not difficult at all      History Julie Kim has a past medical history of Anemia, Angio-edema, Anxiety, Arthritis, Endometriosis, Endometriosis, Headache, Hypertension, Hypoglycemia, Low iron, and Urticaria.   She has a past surgical history that includes none and Laparoscopic ovarian cystectomy (N/A, 06/16/2016).   Her family history includes Allergy (severe) in her father and mother; Eczema in her father; Heart disease in her paternal uncle; Heart disease (age of onset: 0) in her brother; Kidney disease in her maternal grandfather; Psoriasis in her father.She reports that she has never smoked. She has been exposed to tobacco smoke. She has never used smokeless tobacco. She reports that she does not drink alcohol and does not use drugs.    ROS Review of Systems  Constitutional: Negative.   HENT: Negative.    Eyes:  Negative for visual disturbance.  Respiratory:  Negative for shortness of breath.   Cardiovascular:  Negative for chest pain.  Gastrointestinal:  Negative for abdominal pain.  Musculoskeletal:  Negative for arthralgias.    Objective:  BP 114/77   Pulse 78   Temp 97.8 F (36.6 C)   Ht 5\' 3"  (1.6 m)   Wt 196 lb 9.6 oz (89.2 kg)   SpO2 98%    BMI 34.83 kg/m   BP Readings from Last 3 Encounters:  03/23/22 114/77  12/14/21 118/81  09/23/21 118/76    Wt Readings from Last 3 Encounters:  03/23/22 196 lb 9.6 oz (89.2 kg)  12/14/21 193 lb 3.2 oz (87.6 kg)  09/23/21 202 lb 4 oz (91.7 kg)     Physical Exam Constitutional:      General: She is not in acute distress.    Appearance: She is well-developed.  Cardiovascular:     Rate and Rhythm: Normal rate and regular rhythm.  Pulmonary:     Breath sounds: Normal breath sounds.  Musculoskeletal:        General: Normal range of motion.  Skin:    General: Skin is warm and dry.  Neurological:     Mental Status: She is alert and oriented to person, place, and time.       Assessment & Plan:   Julie Kim was seen today for anxiety and medication refill.  Diagnoses and all orders for this visit:  GAD (generalized anxiety disorder) -     busPIRone (BUSPAR) 5 MG tablet; Take 1 tablet (5 mg total) by mouth 3 (three) times daily. -     citalopram (CELEXA) 20 MG tablet; Take 1 tablet (20 mg total) by mouth daily.  NOV!!  Other orders -     EPINEPHrine 0.3 mg/0.3 mL IJ SOAJ injection; INJECT CONTENTS OF 1 PEN AS NEEDED FOR ALLERGIC REACTION       I have discontinued Letzy Vohs's diphenhydrAMINE, fluticasone, Ferrous Sulfate (IRON PO), guaiFENesin, pseudoephedrine, olopatadine, and azithromycin. I am also having her maintain her busPIRone, citalopram, and EPINEPHrine.  Allergies as of 03/23/2022       Reactions   Penicillins Hives   Has patient had a PCN reaction causing immediate rash, facial/tongue/throat swelling, SOB or lightheadedness with hypotension: Yes Has patient had a PCN reaction causing severe rash involving mucus membranes or skin necrosis: No Has patient had a PCN reaction that required hospitalization No Has patient had a PCN reaction occurring within the last 10 years: No If all of the above answers are "NO", then may proceed with Cephalosporin use.    Alpha-gal         Medication List        Accurate as of March 23, 2022  2:38 PM. If you have any questions, ask your nurse or doctor.          STOP taking these medications    azithromycin 250 MG tablet Commonly known as: ZITHROMAX Stopped by: Mechele Claude, MD   diphenhydrAMINE 25 MG tablet Commonly known as: BENADRYL Stopped by: Mechele Claude, MD   fluticasone 50 MCG/ACT nasal spray Commonly known as: FLONASE Stopped by: Mechele Claude, MD   guaiFENesin 600 MG 12 hr tablet Commonly known as: Mucinex Stopped by: Mechele Claude, MD   IRON PO Stopped by: Mechele Claude, MD   olopatadine 0.1 % ophthalmic solution Commonly known as: Patanol Stopped by: Mechele Claude, MD   pseudoephedrine 30 MG tablet Commonly known as: Sudafed Stopped by: Mechele Claude, MD       TAKE these medications    busPIRone 5 MG tablet Commonly known as: BUSPAR Take 1 tablet (5 mg total) by mouth 3 (three) times daily. What changed: when to take this   citalopram 20 MG tablet Commonly known as: CELEXA Take 1 tablet (20 mg total) by mouth daily. NOV!!   EPINEPHrine 0.3 mg/0.3 mL Soaj injection Commonly known as: EPI-PEN INJECT CONTENTS OF 1 PEN AS NEEDED FOR ALLERGIC REACTION         Follow-up: Return in about 6 months (around 09/21/2022).  Mechele Claude, M.D.

## 2022-04-03 ENCOUNTER — Telehealth (INDEPENDENT_AMBULATORY_CARE_PROVIDER_SITE_OTHER): Payer: Self-pay | Admitting: Nurse Practitioner

## 2022-04-03 DIAGNOSIS — R051 Acute cough: Secondary | ICD-10-CM

## 2022-04-03 MED ORDER — PSEUDOEPH-BROMPHEN-DM 30-2-10 MG/5ML PO SYRP: 5.0000 mL | ORAL_SOLUTION | Freq: Four times a day (QID) | ORAL | 0 refills | Status: DC | PRN

## 2022-04-03 MED ORDER — PREDNISONE 10 MG PO TABS: 10.0000 mg | ORAL_TABLET | Freq: Every day | ORAL | 0 refills | Status: DC

## 2022-04-03 MED ORDER — SALINE SPRAY 0.65 % NA SOLN: 1.0000 | NASAL | 5 refills | Status: DC | PRN

## 2022-04-03 NOTE — Progress Notes (Signed)
   Virtual Visit  Note Due to COVID-19 pandemic this visit was conducted virtually. This visit type was conducted due to national recommendations for restrictions regarding the COVID-19 Pandemic (e.g. social distancing, sheltering in place) in an effort to limit this patient's exposure and mitigate transmission in our community. All issues noted in this document were discussed and addressed.  A physical exam was not performed with this format.  I connected with Julie Kim on 04/03/22 at 11 AM by telephone and verified that I am speaking with the correct person using two identifiers. Julie Kim is currently located at home during visit. The provider, Daryll Drown, NP is located in their office at time of visit.  I discussed the limitations, risks, security and privacy concerns of performing an evaluation and management service by telephone and the availability of in person appointments. I also discussed with the patient that there may be a patient responsible charge related to this service. The patient expressed understanding and agreed to proceed.   History and Present Illness:  URI  This is a new problem. The current episode started in the past 7 days. The problem has been unchanged. There has been no fever. Associated symptoms include congestion, coughing and rhinorrhea. Pertinent negatives include no abdominal pain, headaches, joint pain, nausea, rash, sinus pain, sneezing or sore throat. She has tried acetaminophen and decongestant for the symptoms. The treatment provided no relief.      Review of Systems  Constitutional: Negative.  Negative for chills and fever.  HENT:  Positive for congestion and rhinorrhea. Negative for sinus pain, sneezing and sore throat.   Eyes: Negative.   Respiratory:  Positive for cough.   Cardiovascular: Negative.   Gastrointestinal:  Negative for abdominal pain and nausea.  Musculoskeletal:  Negative for joint pain.  Skin: Negative.  Negative for  itching and rash.  Neurological:  Negative for headaches.  All other systems reviewed and are negative.    Observations/Objective: Televisit patient not in distress  Assessment and Plan: Patient presents with symptoms of upper respiratory infection, cough congestion, sinus pressure in the past few days.  Counter medication has not helped with symptoms.  Advised patient to take meds as prescribed - Use a cool mist humidifier  -Use saline nose sprays frequently -Force fluids -For fever or aches or pains- take Tylenol or ibuprofen. -If symptoms do not improve, she may need to be COVID tested to rule this out  Follow Up Instructions: Follow-up with worsening unresolved symptoms     I discussed the assessment and treatment plan with the patient. The patient was provided an opportunity to ask questions and all were answered. The patient agreed with the plan and demonstrated an understanding of the instructions.   The patient was advised to call back or seek an in-person evaluation if the symptoms worsen or if the condition fails to improve as anticipated.  The above assessment and management plan was discussed with the patient. The patient verbalized understanding of and has agreed to the management plan. Patient is aware to call the clinic if symptoms persist or worsen. Patient is aware when to return to the clinic for a follow-up visit. Patient educated on when it is appropriate to go to the emergency department.   Time call ended:  11:12 am   I provided 12 minutes of  non face-to-face time during this encounter.    Daryll Drown, NP

## 2022-04-06 ENCOUNTER — Other Ambulatory Visit: Payer: Self-pay | Admitting: Nurse Practitioner

## 2022-04-06 DIAGNOSIS — R051 Acute cough: Secondary | ICD-10-CM

## 2022-04-09 ENCOUNTER — Encounter: Payer: Self-pay | Admitting: Nurse Practitioner

## 2022-04-10 ENCOUNTER — Other Ambulatory Visit: Payer: Self-pay | Admitting: Nurse Practitioner

## 2022-04-10 DIAGNOSIS — J069 Acute upper respiratory infection, unspecified: Secondary | ICD-10-CM

## 2022-04-10 MED ORDER — AZITHROMYCIN 250 MG PO TABS: ORAL_TABLET | ORAL | 0 refills | Status: AC

## 2022-04-17 ENCOUNTER — Telehealth: Payer: Self-pay | Admitting: Family Medicine

## 2022-04-17 NOTE — Telephone Encounter (Signed)
Patient calling because she is not feeling any better. She had a video visit on 11/13 and was given medication but her cough is no better. Please call back and advise.

## 2022-04-17 NOTE — Telephone Encounter (Signed)
Scheduled in office appt for tomorrow

## 2022-04-18 ENCOUNTER — Ambulatory Visit (INDEPENDENT_AMBULATORY_CARE_PROVIDER_SITE_OTHER): Payer: Self-pay | Admitting: Nurse Practitioner

## 2022-04-18 ENCOUNTER — Encounter: Payer: Self-pay | Admitting: Nurse Practitioner

## 2022-04-18 ENCOUNTER — Ambulatory Visit (INDEPENDENT_AMBULATORY_CARE_PROVIDER_SITE_OTHER): Payer: Self-pay

## 2022-04-18 VITALS — BP 135/83 | HR 85 | Temp 98.7°F | Ht 63.0 in | Wt 194.0 lb

## 2022-04-18 DIAGNOSIS — J069 Acute upper respiratory infection, unspecified: Secondary | ICD-10-CM

## 2022-04-18 MED ORDER — DM-GUAIFENESIN ER 30-600 MG PO TB12: 1.0000 | ORAL_TABLET | Freq: Two times a day (BID) | ORAL | 0 refills | Status: DC

## 2022-04-18 MED ORDER — ALBUTEROL SULFATE HFA 108 (90 BASE) MCG/ACT IN AERS: 1.0000 | INHALATION_SPRAY | Freq: Four times a day (QID) | RESPIRATORY_TRACT | 2 refills | Status: DC | PRN

## 2022-04-18 MED ORDER — BENZONATATE 100 MG PO CAPS: 100.0000 mg | ORAL_CAPSULE | Freq: Three times a day (TID) | ORAL | 0 refills | Status: DC | PRN

## 2022-04-18 NOTE — Patient Instructions (Signed)

## 2022-04-18 NOTE — Progress Notes (Signed)
Acute Office Visit  Subjective:     Patient ID: Julie Kim, female    DOB: [birth year], 31 y.o.   MRN: [id number]  Chief Complaint  Patient presents with   Cough    This has been going on since 11/4    Cough This is a recurrent problem. The current episode started 1 to 4 weeks ago. The problem has been gradually worsening. The problem occurs constantly. Associated symptoms include hemoptysis and nasal congestion. Pertinent negatives include no chest pain, ear congestion, fever, rash or sore throat. She has tried oral steroids and prescription cough suppressant for the symptoms. The treatment provided no relief.    Review of Systems  Constitutional: Negative.  Negative for fever.  HENT:  Positive for congestion. Negative for sore throat.   Respiratory:  Positive for cough and hemoptysis.   Cardiovascular:  Negative for chest pain.  Skin: Negative.  Negative for itching and rash.  All other systems reviewed and are negative.       Objective:    BP 135/83   Pulse 85   Temp 98.7 F (37.1 C)   Ht 5\' 3"  (1.6 m)   Wt 194 lb (88 kg)   LMP 04/17/2022 (Exact Date) Comment: start  SpO2 97%   BMI 34.37 kg/m  BP Readings from Last 3 Encounters:  04/18/22 135/83  03/23/22 114/77  12/14/21 118/81   Wt Readings from Last 3 Encounters:  04/18/22 194 lb (88 kg)  03/23/22 196 lb 9.6 oz (89.2 kg)  12/14/21 193 lb 3.2 oz (87.6 kg)      Physical Exam Vitals and nursing note reviewed.  Constitutional:      Appearance: Normal appearance.  HENT:     Head: Normocephalic.     Right Ear: External ear normal.     Left Ear: External ear normal.     Nose: Congestion present.     Mouth/Throat:     Mouth: Mucous membranes are moist.  Eyes:     Conjunctiva/sclera: Conjunctivae normal.  Cardiovascular:     Rate and Rhythm: Normal rate.     Pulses: Normal pulses.     Heart sounds: Normal heart sounds.  Pulmonary:     Effort: Pulmonary effort is normal.     Breath sounds: Normal  breath sounds.  Abdominal:     General: Bowel sounds are normal.  Skin:    General: Skin is warm.     Findings: No erythema or rash.  Neurological:     Mental Status: She is alert and oriented to person, place, and time.     No results found for any visits on 04/18/22.      Assessment & Plan:  Patient presents with unresolved cough and congestion in the past 2 weeks. Patient is concerned that symptoms are worse. Completed chest x-ray to rule out pneumonia, results pending. Take meds as prescribed - Use a cool mist humidifier  -Use saline nose sprays frequently -Force fluids -For fever or aches or pains- take Tylenol or ibuprofen. -If symptoms do not improve, she may need to be COVID tested to rule this out Follow up with worsening unresolved symptoms  Problem List Items Addressed This Visit   None Visit Diagnoses     URI with cough and congestion    -  Primary   Relevant Medications   benzonatate (TESSALON PERLES) 100 MG capsule   albuterol (VENTOLIN HFA) 108 (90 Base) MCG/ACT inhaler   dextromethorphan-guaiFENesin (MUCINEX DM) 30-600 MG 12hr tablet   Other  Relevant Orders   DG Chest 2 View       Meds ordered this encounter  Medications   benzonatate (TESSALON PERLES) 100 MG capsule    Sig: Take 1 capsule (100 mg total) by mouth 3 (three) times daily as needed.    Dispense:  20 capsule    Refill:  0    Order Specific Question:   Supervising Provider    Answer:   Standley Brooking   albuterol (VENTOLIN HFA) 108 (90 Base) MCG/ACT inhaler    Sig: Inhale 1-2 puffs into the lungs every 6 (six) hours as needed for wheezing or shortness of breath.    Dispense:  8 g    Refill:  2    Order Specific Question:   Supervising Provider    Answer:   Standley Brooking   dextromethorphan-guaiFENesin (MUCINEX DM) 30-600 MG 12hr tablet    Sig: Take 1 tablet by mouth 2 (two) times daily.    Dispense:  30 tablet    Refill:  0    Order Specific Question:   Supervising  Provider    Answer:   Mechele Claude [id number]    Return if symptoms worsen or fail to improve.  Daryll Drown, NP

## 2022-12-22 ENCOUNTER — Encounter: Payer: Self-pay | Admitting: Family Medicine

## 2023-06-03 ENCOUNTER — Emergency Department (HOSPITAL_COMMUNITY): Payer: Self-pay

## 2023-06-03 ENCOUNTER — Emergency Department (HOSPITAL_COMMUNITY)
Admission: EM | Admit: 2023-06-03 | Discharge: 2023-06-03 | Disposition: A | Discharge status: Home / Self Care | Payer: Self-pay | Attending: Emergency Medicine | Admitting: Emergency Medicine

## 2023-06-03 ENCOUNTER — Other Ambulatory Visit: Payer: Self-pay

## 2023-06-03 ENCOUNTER — Encounter (HOSPITAL_COMMUNITY): Payer: Self-pay

## 2023-06-03 DIAGNOSIS — R1011 Right upper quadrant pain: Secondary | ICD-10-CM | POA: Insufficient documentation

## 2023-06-03 DIAGNOSIS — R1013 Epigastric pain: Secondary | ICD-10-CM | POA: Insufficient documentation

## 2023-06-03 LAB — CBC
HCT: 30.6 % — ABNORMAL LOW (ref 36.0–46.0)
Hemoglobin: 9 g/dL — ABNORMAL LOW (ref 12.0–15.0)
MCH: 21.4 pg — ABNORMAL LOW (ref 26.0–34.0)
MCHC: 29.4 g/dL — ABNORMAL LOW (ref 30.0–36.0)
MCV: 72.7 fL — ABNORMAL LOW (ref 80.0–100.0)
Platelets: 363 10*3/uL (ref 150–400)
RBC: 4.21 MIL/uL (ref 3.87–5.11)
RDW: 16.2 % — ABNORMAL HIGH (ref 11.5–15.5)
WBC: 11.2 10*3/uL — ABNORMAL HIGH (ref 4.0–10.5)
nRBC: 0 % (ref 0.0–0.2)

## 2023-06-03 LAB — COMPREHENSIVE METABOLIC PANEL
ALT: 17 U/L (ref 0–44)
AST: 18 U/L (ref 15–41)
Albumin: 4.3 g/dL (ref 3.5–5.0)
Alkaline Phosphatase: 73 U/L (ref 38–126)
Anion gap: 9 (ref 5–15)
BUN: 16 mg/dL (ref 6–20)
CO2: 25 mmol/L (ref 22–32)
Calcium: 9.2 mg/dL (ref 8.9–10.3)
Chloride: 101 mmol/L (ref 98–111)
Creatinine, Ser: 0.59 mg/dL (ref 0.44–1.00)
GFR, Estimated: >60 mL/min (ref >60)
Glucose, Bld: 112 mg/dL — ABNORMAL HIGH (ref 70–99)
Potassium: 3.7 mmol/L (ref 3.5–5.1)
Sodium: 135 mmol/L (ref 135–145)
Total Bilirubin: 0.7 mg/dL (ref 0.0–1.2)
Total Protein: 7.9 g/dL (ref 6.5–8.1)

## 2023-06-03 LAB — URINALYSIS, ROUTINE W REFLEX MICROSCOPIC
Bacteria, UA: NONE SEEN
Bilirubin Urine: NEGATIVE
Glucose, UA: NEGATIVE mg/dL
Ketones, ur: NEGATIVE mg/dL
Nitrite: NEGATIVE
Protein, ur: NEGATIVE mg/dL
Specific Gravity, Urine: 1.019 (ref 1.005–1.030)
pH: 5 (ref 5.0–8.0)

## 2023-06-03 LAB — PREGNANCY, URINE: Preg Test, Ur: NEGATIVE

## 2023-06-03 LAB — LIPASE, BLOOD: Lipase: 26 U/L (ref 11–51)

## 2023-06-03 MED ORDER — IOHEXOL 300 MG/ML  SOLN
100.0000 mL | Freq: Once | INTRAMUSCULAR | Status: AC | PRN
  Administered 2023-06-03: 100 mL via INTRAVENOUS

## 2023-06-03 NOTE — ED Triage Notes (Addendum)
 Pt to ED via pov cc abdominal pain onset 1830. States it was a sudden sharp epigastric pain radiating to right flank. Vomited x3. Denies diarrhea. Hx of endometriosis with ovarian cysts. Just finished menstrual cycle and pt states she started spotting. Took ibuprofen at home with relief but pain returned after dose wore off.

## 2023-06-03 NOTE — ED Provider Notes (Signed)
 [place] EMERGENCY DEPARTMENT AT [facility]  Provider Note  CSN: [id number] Arrival date & time: 06/03/23 9667  History Chief Complaint  Patient presents with   Abdominal Pain    Julie Kim is a 33 y.o. female with history of endometriosis, reports sudden onset of sharp epigastric pain around 1800hrs while at a birthday party, radiating into RUQ and into her back. Associated with nausea and vomiting. She took some motrin, hot bath and some pepto and improved, but pain returned around midnight. She is feeling better now. Pain is not similar to previous endometriosis or ovarian cysts.    Home Medications Prior to Admission medications   Medication Sig Start Date End Date Taking? Authorizing Provider  albuterol  (VENTOLIN  HFA) 108 (90 Base) MCG/ACT inhaler Inhale 1-2 puffs into the lungs every 6 (six) hours as needed for wheezing or shortness of breath. 04/18/22   Ijaola, Onyeje M, NP  benzonatate  (TESSALON  PERLES) 100 MG capsule Take 1 capsule (100 mg total) by mouth 3 (three) times daily as needed. 04/18/22   Ijaola, Onyeje M, NP  busPIRone  (BUSPAR ) 5 MG tablet Take 1 tablet (5 mg total) by mouth 3 (three) times daily. 03/23/22   Zollie Lowers, MD  citalopram  (CELEXA ) 20 MG tablet Take 1 tablet (20 mg total) by mouth daily. NOV!! 03/23/22   Zollie Lowers, MD  dextromethorphan-guaiFENesin [facility] DM) 30-600 MG 12hr tablet Take 1 tablet by mouth 2 (two) times daily. 04/18/22   Cherylene Homer HERO, NP  EPINEPHrine  0.3 mg/0.3 mL IJ SOAJ injection INJECT CONTENTS OF 1 PEN AS NEEDED FOR ALLERGIC REACTION 03/23/22   Zollie Lowers, MD  sodium chloride  (OCEAN) 0.65 % SOLN nasal spray Place 1 spray into both nostrils as needed for congestion. 04/03/22   Cherylene Homer HERO, NP     Allergies    Penicillins and Alpha-gal   Review of Systems   Review of Systems Please see HPI for pertinent positives and negatives  Physical Exam BP (!) 137/91   Pulse 86   Temp 98.8 F (37.1 C)    Resp 18   Ht 5' 2 (1.575 m)   Wt 84.8 kg   LMP 05/29/2023 (Exact Date)   SpO2 100%   BMI 34.20 kg/m   Physical Exam Vitals and nursing note reviewed.  Constitutional:      Appearance: Normal appearance.  HENT:     Head: Normocephalic and atraumatic.     Nose: Nose normal.     Mouth/Throat:     Mouth: Mucous membranes are moist.  Eyes:     Extraocular Movements: Extraocular movements intact.     Conjunctiva/sclera: Conjunctivae normal.  Cardiovascular:     Rate and Rhythm: Normal rate.  Pulmonary:     Effort: Pulmonary effort is normal.     Breath sounds: Normal breath sounds.  Abdominal:     General: Abdomen is flat.     Palpations: Abdomen is soft.     Tenderness: There is abdominal tenderness in the right upper quadrant and epigastric area. There is no guarding. Negative signs include Murphy's sign and McBurney's sign.  Musculoskeletal:        General: No swelling. Normal range of motion.     Cervical back: Neck supple.  Skin:    General: Skin is warm and dry.  Neurological:     General: No focal deficit present.     Mental Status: She is alert.  Psychiatric:        Mood and Affect: Mood normal.  ED Results / Procedures / Treatments   EKG None  Procedures Procedures  Medications Ordered in the ED Medications  iohexol  (OMNIPAQUE ) 300 MG/ML solution 100 mL (100 mLs Intravenous Contrast Given 06/03/23 0524)    Initial Impression and Plan  Patient here for epigastric and RUQ pain. Concerning for biliary process. Denies EtOH use.Will check labs, send for CT. She declines pain medication at this time.   ED Course   Clinical Course as of 06/03/23 9378  [place] Jun 03, 2023  0512 HCG is neg. CMP and lipase are normal.  [CS]  0515 CBC with anemia, similar to previous.  [CS]  [phone] UA with some WBC, but no urinary symptoms to suggest UTI.  I personally viewed the images from radiology studies and agree with radiologist interpretation: CT without acute findings.  Mild fluid in pelvis may be physiologic, she is not having pelvic pain and no signs of ovarian cyst or mass today.  Her pain has been minimal while in the ED. Recommend she continue with conservative management for now. PCP follow up, RTED for worsening pain or any other concerns.   [CS]    Clinical Course User Index [CS] Roselyn Carlin NOVAK, MD     MDM Rules/Calculators/A&P Medical Decision Making Problems Addressed: Epigastric pain: acute illness or injury  Amount and/or Complexity of Data Reviewed Labs: ordered. Decision-making details documented in ED Course. Radiology: ordered and independent interpretation performed. Decision-making details documented in ED Course.  Risk Prescription drug management.     Final Clinical Impression(s) / ED Diagnoses Final diagnoses:  Epigastric pain    Rx / DC Orders ED Discharge Orders     None        Roselyn Carlin NOVAK, MD 06/03/23 [phone]

## 2023-06-05 ENCOUNTER — Other Ambulatory Visit (HOSPITAL_COMMUNITY)
Admission: RE | Admit: 2023-06-05 | Discharge: 2023-06-05 | Disposition: A | Discharge status: Home / Self Care | Payer: Self-pay | Source: Ambulatory Visit | Attending: Adult Health | Admitting: Adult Health

## 2023-06-05 ENCOUNTER — Ambulatory Visit (INDEPENDENT_AMBULATORY_CARE_PROVIDER_SITE_OTHER): Payer: Self-pay | Admitting: Adult Health

## 2023-06-05 ENCOUNTER — Encounter: Payer: Self-pay | Admitting: Adult Health

## 2023-06-05 VITALS — BP 123/81 | HR 83 | Ht 62.0 in | Wt 186.5 lb

## 2023-06-05 DIAGNOSIS — Z8742 Personal history of other diseases of the female genital tract: Secondary | ICD-10-CM

## 2023-06-05 DIAGNOSIS — R1013 Epigastric pain: Secondary | ICD-10-CM | POA: Insufficient documentation

## 2023-06-05 DIAGNOSIS — R9389 Abnormal findings on diagnostic imaging of other specified body structures: Secondary | ICD-10-CM | POA: Insufficient documentation

## 2023-06-05 DIAGNOSIS — N841 Polyp of cervix uteri: Secondary | ICD-10-CM

## 2023-06-05 DIAGNOSIS — D509 Iron deficiency anemia, unspecified: Secondary | ICD-10-CM | POA: Insufficient documentation

## 2023-06-05 DIAGNOSIS — N920 Excessive and frequent menstruation with regular cycle: Secondary | ICD-10-CM

## 2023-06-05 NOTE — Progress Notes (Signed)
 Subjective:     Patient ID: Julie Kim, female   DOB: February 23, 1991, 33 y.o.   MRN: [id number]  HPI Julie Kim is a 33 year old white female, married, G0P0, in for ER follow up, was seen in ER at [facility] 06/03/23 for abdominal pain, epigastric region and had some nausea and vomiting. She had a CT that showed: IMPRESSION: 1. Negative CT appearance of the Abdomen. 2. Trace free fluid in the pelvis is most likely physiologic. There is asymmetry of ovarian size (larger on the right) but no discrete ovarian mass or evidence of regional inflammation. In the setting of endometriosis a follow-up Pelvis Ultrasound might be valuable to exclude a right side endometrioma. She is feeling much better, but achy. Her HGB was 9 in ER on 06/03/23. LFTs and lipase normal and had negative UPT.      Component Value Date/Time   DIAGPAP  12/27/2020 1041    - Negative for intraepithelial lesion or malignancy (NILM)   HPVHIGH Negative 12/27/2020 1041   ADEQPAP  12/27/2020 1041    Satisfactory for evaluation; transformation zone component PRESENT.   PCP is Dr Zollie    Review of Systems Achy feeling in abdomen  Periods heavy, last about 5 days with 3 heavy days, will change overnight pad every 3-4 hours    Denies any problems with urination, has constipation at times. Reviewed past medical,surgical, social and family history. Reviewed medications and allergies.  Objective:   Physical Exam BP 123/81 (BP Location: Left Arm, Patient Position: Sitting, Cuff Size: Normal)   Pulse 83   Ht 5' 2 (1.575 m)   Wt 186 lb 8 oz (84.6 kg)   LMP 05/29/2023 (Exact Date)   BMI 34.11 kg/m     Skin warm and dry.Pelvic: external genitalia is normal in appearance no lesions, vagina: pink,urethra has no lesions or masses noted, cervix has +cervical polyp, pt gave verbal consent and signed permit and polyp was easily removed with forceps using twisting motion, uterus: normal size, shape and contour, non tender, no masses felt,  adnexa: no masses or tenderness noted. Bladder is non tender and no masses felt. Still  mildly tender epigastric area.  [place] - 06/05/23 0908       Pregnancy Intention Screening   Does the patient want to become pregnant in the next year? Ok Either Way    Does the patient's partner want to become pregnant in the next year? Ok Either Way    Would the patient like to discuss contraceptive options today? No      Contraception Wrap Up   Current Method No Contraceptive Precautions    End Method No Contraception Precautions    Contraception Counseling Provided No             Assessment:     1. Epigastric pain Achy now CT results as noted above - US  PELVIC COMPLETE WITH TRANSVAGINAL; Future  2. Cervical polyp Cervical polyp removed and sent to pathology   3. History of endometriosis Will get US  to assess right ovary  - US  PELVIC COMPLETE WITH TRANSVAGINAL; Future  4. Abnormal CT scan (Primary) CT showed right ovary larger that left will get US  to assess for endometrioma, in setting of history  of endometriosis  - US  PELVIC COMPLETE WITH TRANSVAGINAL; Future  5. Menorrhagia with regular cycle Periods heavy for about 3 days, may change overnight  pad every 3-4 hours   6. Iron deficiency anemia, unspecified iron deficiency anemia type HGB 9, increase OTC iron  to 2 x daily for now     Plan:     Return in 2 weeks for US , will talk when results back  She is self pay, get form for CFA

## 2023-06-07 LAB — SURGICAL PATHOLOGY

## 2023-06-18 ENCOUNTER — Ambulatory Visit (INDEPENDENT_AMBULATORY_CARE_PROVIDER_SITE_OTHER): Payer: Self-pay | Admitting: Family Medicine

## 2023-06-18 ENCOUNTER — Encounter: Payer: Self-pay | Admitting: Family Medicine

## 2023-06-18 VITALS — BP 114/78 | HR 100 | Temp 97.5°F | Ht 62.0 in | Wt 192.0 lb

## 2023-06-18 DIAGNOSIS — F411 Generalized anxiety disorder: Secondary | ICD-10-CM

## 2023-06-18 DIAGNOSIS — Z91018 Allergy to other foods: Secondary | ICD-10-CM

## 2023-06-18 DIAGNOSIS — D508 Other iron deficiency anemias: Secondary | ICD-10-CM

## 2023-06-18 MED ORDER — BUSPIRONE HCL 5 MG PO TABS: 5.0000 mg | ORAL_TABLET | Freq: Three times a day (TID) | ORAL | 3 refills | Status: AC

## 2023-06-18 MED ORDER — CITALOPRAM HYDROBROMIDE 20 MG PO TABS: 20.0000 mg | ORAL_TABLET | Freq: Every day | ORAL | 3 refills | Status: AC

## 2023-06-18 MED ORDER — EPINEPHRINE 0.3 MG/0.3ML IJ SOAJ: INTRAMUSCULAR | 0 refills | Status: AC

## 2023-06-18 NOTE — Progress Notes (Signed)
Subjective:  Patient ID: Julie Kim, female    DOB: 11-11-1990  Age: 33 y.o. MRN: [id number]  CC: Medication Refill and boyle (Lower abdomin. Would like abx so it does not grow or drain. )   HPI Julie Kim presents for recheck of depression. Also tired all the time. Dx with anemia from low iron 2 weeks ago at [facility] Was found to have a ruptured ovarian cyst that her GYN is now managing. Now using BID iron supplement, 325 mg.   Her depression is stable. She takes the citalopram, but only takes the buspar prn. IT seems to help. Usually needs it about 1 time a month.Tiredness in PHQ felt to be related more to the anemia than the depression.   Prescribed epi due to previous alpha gal reaction. Due for refill.      06/18/2023    1:02 PM 03/23/2022    1:59 PM 12/14/2021    9:05 AM 09/23/2021    4:13 PM  GAD 7 : Generalized Anxiety Score  Nervous, Anxious, on Edge 1 1 0 0  Control/stop worrying 0 0 0 0  Worry too much - different things 0 0 0 0  Trouble relaxing 0 1 0 0  Restless 0 1 0 0  Easily annoyed or irritable 0 1 0 0  Afraid - awful might happen 0 0 0 0  Total GAD 7 Score 1 4 0 0  Anxiety Difficulty  Not difficult at all Not difficult at all Not difficult at all        06/18/2023    1:02 PM 03/23/2022    1:58 PM 03/23/2022    1:57 PM  Depression screen PHQ 2/9  Decreased Interest 1 1 0  Down, Depressed, Hopeless 0 0 0  PHQ - 2 Score 1 1 0  Altered sleeping 2 1   Tired, decreased energy 3 3   Change in appetite 1 0   Feeling bad or failure about yourself  0 0   Trouble concentrating 0 0   Moving slowly or fidgety/restless 0 0   Suicidal thoughts 0 0   PHQ-9 Score 7 5   Difficult doing work/chores Somewhat difficult Not difficult at all     History Julie Kim has a past medical history of Anemia, Angio-edema, Anxiety, Arthritis, Endometriosis, Endometriosis, Headache, Hypertension, Hypoglycemia, Low iron, and Urticaria.   She has a past surgical history that includes none  and Laparoscopic ovarian cystectomy (N/A, 06/16/2016).   Her family history includes Allergy (severe) in her father and mother; Eczema in her father; Heart disease in her paternal uncle; Heart disease (age of onset: 0) in her brother; Kidney disease in her maternal grandfather; Psoriasis in her father.She reports that she has never smoked. She has been exposed to tobacco smoke. She has never used smokeless tobacco. She reports that she does not drink alcohol and does not use drugs.    ROS Review of Systems  Constitutional: Negative.   HENT: Negative.    Eyes:  Negative for visual disturbance.  Respiratory:  Negative for shortness of breath.   Cardiovascular:  Negative for chest pain.  Gastrointestinal:  Negative for abdominal pain.  Musculoskeletal:  Negative for arthralgias.    Objective:  BP 114/78   Pulse 100   Temp (!) 97.5 F (36.4 C)   Ht 5\' 2"  (1.575 m)   Wt 192 lb (87.1 kg)   LMP 05/29/2023 (Exact Date)   SpO2 98%   BMI 35.12 kg/m   BP Readings from Last  3 Encounters:  06/18/23 114/78  06/05/23 123/81  06/03/23 129/81    Wt Readings from Last 3 Encounters:  06/18/23 192 lb (87.1 kg)  06/05/23 186 lb 8 oz (84.6 kg)  06/03/23 187 lb (84.8 kg)     Physical Exam Constitutional:      General: She is not in acute distress.    Appearance: She is well-developed.  Cardiovascular:     Rate and Rhythm: Normal rate and regular rhythm.  Pulmonary:     Breath sounds: Normal breath sounds.  Musculoskeletal:        General: Normal range of motion.  Skin:    General: Skin is warm and dry.  Neurological:     Mental Status: She is alert and oriented to person, place, and time.       Assessment & Plan:   Julie Kim was seen today for medication refill and boyle.  Diagnoses and all orders for this visit:  Other iron deficiency anemia  GAD (generalized anxiety disorder) -     citalopram (CELEXA) 20 MG tablet; Take 1 tablet (20 mg total) by mouth daily. NOV!! -      busPIRone (BUSPAR) 5 MG tablet; Take 1 tablet (5 mg total) by mouth 3 (three) times daily.  Allergy to alpha-gal  Other orders -     EPINEPHrine 0.3 mg/0.3 mL IJ SOAJ injection; INJECT CONTENTS OF 1 PEN AS NEEDED FOR ALLERGIC REACTION       I have discontinued Jonee Rolfe's sodium chloride and albuterol. I am also having her maintain her citalopram, busPIRone, and EPINEPHrine.  Allergies as of 06/18/2023       Reactions   Penicillins Hives   Has patient had a PCN reaction causing immediate rash, facial/tongue/throat swelling, SOB or lightheadedness with hypotension: Yes Has patient had a PCN reaction causing severe rash involving mucus membranes or skin necrosis: No Has patient had a PCN reaction that required hospitalization No Has patient had a PCN reaction occurring within the last 10 years: No If all of the above answers are "NO", then may proceed with Cephalosporin use.   Alpha-gal         Medication List        Accurate as of June 18, 2023  4:05 PM. If you have any questions, ask your nurse or doctor.          STOP taking these medications    albuterol 108 (90 Base) MCG/ACT inhaler Commonly known as: VENTOLIN HFA Stopped by: Lilley Hubble   sodium chloride 0.65 % Soln nasal spray Commonly known as: OCEAN Stopped by: Olimpia Tinch       TAKE these medications    busPIRone 5 MG tablet Commonly known as: BUSPAR Take 1 tablet (5 mg total) by mouth 3 (three) times daily.   citalopram 20 MG tablet Commonly known as: CELEXA Take 1 tablet (20 mg total) by mouth daily. NOV!!   EPINEPHrine 0.3 mg/0.3 mL Soaj injection Commonly known as: EPI-PEN INJECT CONTENTS OF 1 PEN AS NEEDED FOR ALLERGIC REACTION         Follow-up: Return in about 6 months (around 12/16/2023) for Depression, Anxiety, anemia.  Mechele Claude, M.D.

## 2023-06-19 ENCOUNTER — Ambulatory Visit (INDEPENDENT_AMBULATORY_CARE_PROVIDER_SITE_OTHER): Payer: Self-pay | Admitting: Radiology

## 2023-06-19 DIAGNOSIS — R1013 Epigastric pain: Secondary | ICD-10-CM

## 2023-06-19 DIAGNOSIS — R9389 Abnormal findings on diagnostic imaging of other specified body structures: Secondary | ICD-10-CM

## 2023-06-19 DIAGNOSIS — Z8742 Personal history of other diseases of the female genital tract: Secondary | ICD-10-CM

## 2023-06-19 NOTE — Progress Notes (Signed)
TA and TV imaging obtained - Chaperone: Quila  -  vinyl probe cover used  Anteverted uterus normal in size, no evidence for fibroids, symmetrical myometrial walls There appears to be a small endometriotic lesion that abutts right superior fundal wall measuring 18 x 7 mm.  Lesion is avascular, with thin walls, smoothly marginated with low level echoes internally.   EEC = 16.4 mm in thickness, homogenous echogenic avascular cavity (day 21) No evidence of intracavitary soft tissue defects.  Avascular cervical canal. The right ovary is seen with 3 cysts, homogeneous low level echoes internally, avascular, smoothly marginated, all appear characteristic of endometriomas:  17 x 11 mm, 29 x 18 mm, and 19 x 15 mm.  The Right ovary is not mobile. The Left ovary appears normal and is seen along left posterior ut wall, abutts the ut wall  and is not mobile.   Neg adnexal areas - Tiny amount of clear free fluid within the CDS.

## 2023-12-10 ENCOUNTER — Ambulatory Visit: Payer: Self-pay | Admitting: Family Medicine
